# Patient Record
Sex: Female | Born: 1985 | Race: White | Hispanic: No | Marital: Single | Smoking: Former smoker
Health system: Southern US, Community
[De-identification: ages and names within clinical notes are randomized; demographics above are authoritative.]

## PROBLEM LIST (undated history)

## (undated) DIAGNOSIS — D649 Anemia, unspecified: Secondary | ICD-10-CM

## (undated) DIAGNOSIS — F329 Major depressive disorder, single episode, unspecified: Secondary | ICD-10-CM

## (undated) DIAGNOSIS — E079 Disorder of thyroid, unspecified: Secondary | ICD-10-CM

## (undated) DIAGNOSIS — F32A Depression, unspecified: Secondary | ICD-10-CM

## (undated) DIAGNOSIS — E039 Hypothyroidism, unspecified: Secondary | ICD-10-CM

## (undated) DIAGNOSIS — C801 Malignant (primary) neoplasm, unspecified: Secondary | ICD-10-CM

## (undated) HISTORY — PX: WISDOM TOOTH EXTRACTION: SHX21

## (undated) HISTORY — DX: Malignant (primary) neoplasm, unspecified: C80.1

## (undated) HISTORY — PX: CHOLECYSTECTOMY: SHX55

## (undated) HISTORY — PX: TONSILLECTOMY: SUR1361

---

## 2004-01-31 ENCOUNTER — Emergency Department (HOSPITAL_COMMUNITY): Admission: EM | Admit: 2004-01-31 | Discharge: 2004-01-31 | Payer: Self-pay | Admitting: Emergency Medicine

## 2011-02-02 ENCOUNTER — Encounter (HOSPITAL_BASED_OUTPATIENT_CLINIC_OR_DEPARTMENT_OTHER): Payer: Self-pay | Admitting: *Deleted

## 2011-02-02 ENCOUNTER — Emergency Department (HOSPITAL_BASED_OUTPATIENT_CLINIC_OR_DEPARTMENT_OTHER)
Admission: EM | Admit: 2011-02-02 | Discharge: 2011-02-03 | Disposition: A | Discharge status: Home / Self Care | Payer: PRIVATE HEALTH INSURANCE | Attending: Emergency Medicine | Admitting: Emergency Medicine

## 2011-02-02 DIAGNOSIS — M549 Dorsalgia, unspecified: Secondary | ICD-10-CM | POA: Insufficient documentation

## 2011-02-02 HISTORY — DX: Disorder of thyroid, unspecified: E07.9

## 2011-02-02 LAB — URINALYSIS, ROUTINE W REFLEX MICROSCOPIC
Glucose, UA: NEGATIVE mg/dL
Leukocytes, UA: NEGATIVE
Protein, ur: NEGATIVE mg/dL
Specific Gravity, Urine: 1.004 — ABNORMAL LOW (ref 1.005–1.030)
Urobilinogen, UA: 0.2 mg/dL (ref 0.0–1.0)

## 2011-02-02 LAB — PREGNANCY, URINE: Preg Test, Ur: NEGATIVE

## 2011-02-02 MED ORDER — OXYCODONE-ACETAMINOPHEN 5-325 MG PO TABS
2.0000 | ORAL_TABLET | Freq: Once | ORAL | Status: AC
  Administered 2011-02-02: 2 via ORAL
  Filled 2011-02-02: qty 2

## 2011-02-02 NOTE — ED Notes (Signed)
Left flank pain. Crying.

## 2011-02-03 ENCOUNTER — Emergency Department (INDEPENDENT_AMBULATORY_CARE_PROVIDER_SITE_OTHER): Payer: PRIVATE HEALTH INSURANCE

## 2011-02-03 DIAGNOSIS — R109 Unspecified abdominal pain: Secondary | ICD-10-CM

## 2011-02-03 DIAGNOSIS — M549 Dorsalgia, unspecified: Secondary | ICD-10-CM

## 2011-02-03 MED ORDER — OXYCODONE-ACETAMINOPHEN 5-325 MG PO TABS: 1.0000 | ORAL_TABLET | ORAL | Status: AC | PRN

## 2011-02-03 MED ORDER — ONDANSETRON HCL 4 MG/2ML IJ SOLN
4.0000 mg | Freq: Once | INTRAMUSCULAR | Status: AC
  Administered 2011-02-03: 4 mg via INTRAVENOUS
  Filled 2011-02-03: qty 2

## 2011-02-03 MED ORDER — HYDROMORPHONE HCL PF 1 MG/ML IJ SOLN
1.0000 mg | Freq: Once | INTRAMUSCULAR | Status: AC
  Administered 2011-02-03: 1 mg via INTRAVENOUS
  Filled 2011-02-03: qty 1

## 2011-02-03 MED ORDER — SODIUM CHLORIDE 0.9 % IV BOLUS (SEPSIS)
1000.0000 mL | Freq: Once | INTRAVENOUS | Status: AC
  Administered 2011-02-03: 1000 mL via INTRAVENOUS

## 2011-02-03 NOTE — ED Notes (Signed)
IV infused.  Pain much better

## 2011-02-03 NOTE — ED Provider Notes (Signed)
History     CSN: [id number]  Arrival date & time 02/02/11  2110   First MD Initiated Contact with Patient 02/02/11 2301      Chief Complaint  Patient presents with  . Back Pain     The history is provided by the patient.   the patient reports 2 days of intermittent left flank pain.  She reports the pain is now radiating down into her left groin.  She's had no dysuria or urinary frequency.  She's had no new vaginal discharge.  She denies hematuria.  She's had no fever or chills.  She denies nausea vomiting or diarrhea.  Nothing worsens her symptoms except for movement.  Nothing improves her symptoms.  She does report the pain is worse when she stretches with her left arm or stands straight up.  She does report an that she has been exercising and lifting weights, and her friend in the room reports that she has been "pushing herself more lately".  Her pain is moderate to severe at this time the  Past Medical History  Diagnosis Date  . Thyroid disease   . Asthma     History reviewed. No pertinent past surgical history.  No family history on file.  History  Substance Use Topics  . Smoking status: Never Smoker   . Smokeless tobacco: Not on file  . Alcohol Use: No    OB History    Grav Para Term Preterm Abortions TAB SAB Ect Mult Living                  Review of Systems  All other systems reviewed and are negative.    Allergies  Review of patient's allergies indicates no known allergies.  Home Medications   Current Outpatient Rx  Name Route Sig Dispense Refill  . ALBUTEROL SULFATE HFA 108 (90 BASE) MCG/ACT IN AERS Inhalation Inhale 2 puffs into the lungs every 6 (six) hours as needed. For shortness of breath    . VITAMIN D3 3000 UNITS PO TABS Oral Take 1 tablet by mouth daily.    Marland Kitchen VITAMIN B 12 PO Oral Take 1 tablet by mouth daily.    . IBUPROFEN 800 MG PO TABS Oral Take 800 mg by mouth every 8 (eight) hours as needed. For pain    . LEVOTHYROXINE SODIUM 100 MCG PO  TABS Oral Take 100 mcg by mouth daily.    . OXYCODONE-ACETAMINOPHEN 5-325 MG PO TABS Oral Take 1 tablet by mouth every 4 (four) hours as needed for pain. 115 tablet 0    BP 126/91  Pulse 81  Temp(Src) 97.5 F (36.4 C) (Oral)  Resp 20  SpO2 100%  LMP 02/01/2011  Physical Exam  Nursing note and vitals reviewed. Constitutional: She is oriented to person, place, and time. She appears well-developed and well-nourished. No distress.  HENT:  Head: Normocephalic and atraumatic.  Eyes: EOM are normal.  Neck: Normal range of motion.  Cardiovascular: Normal rate, regular rhythm and normal heart sounds.   Pulmonary/Chest: Effort normal and breath sounds normal.  Abdominal: Soft. She exhibits no distension.       Mild tenderness in her left lower quadrant without guarding or rebound.  No rash present  Genitourinary:       No rash noted.  No left CVA tenderness.  Musculoskeletal: Normal range of motion.  Neurological: She is alert and oriented to person, place, and time.  Skin: Skin is warm and dry.  Psychiatric: She has a normal mood and  affect. Judgment normal.    ED Course  Procedures (including critical care time)  Labs Reviewed  URINALYSIS, ROUTINE W REFLEX MICROSCOPIC - Abnormal; Notable for the following:    Specific Gravity, Urine 1.004 (*)    Hgb urine dipstick TRACE (*)    All other components within normal limits  PREGNANCY, URINE  URINE MICROSCOPIC-ADD ON   Ct Abdomen Pelvis Wo Contrast  02/03/2011  *RADIOLOGY REPORT*  Clinical Data: Back and left flank pain  CT ABDOMEN AND PELVIS WITHOUT CONTRAST  Technique:  Multidetector CT imaging of the abdomen and pelvis was performed following the standard protocol without intravenous contrast. Sagittal and coronal MPR images reconstructed from axial data set.  Comparison: None  Findings: Lung bases clear. Questionable tiny right middle lobe nodule 3 mm diameter image one. No urinary tract calcification, hydronephrosis or ureteral  dilatation. Within limits of a nonenhanced exam no focal abnormalities of the liver, spleen, pancreas, kidneys, or adrenal glands identified. Appendix not localized but no pericecal inflammatory process identified. Probable medication tablet within distal small bowel. Stomach and bowel loops otherwise normal appearance. Tampon in vagina. Bladder, uterus and adnexae unremarkable. Scattered normal-sized mesenteric lymph nodes. No mass, adenopathy, free fluid, or free air. Osseous structures unremarkable.  IMPRESSION: No acute intra abdominal or intrapelvic abnormalities. Question 3 mm right middle lobe pulmonary nodule, recommendation below.  If the patient is at high risk for bronchogenic carcinoma, follow- up chest CT at 1 year is recommended.  If the patient is at low risk, no follow-up is needed.  This recommendation follows the consensus statement: Guidelines for Management of Small Pulmonary Nodules Detected on CT Scans:  A Statement from the Fleischner Society as published in Radiology 2005; 237:395-400.  Available online at:  [email or web address].cz.  Original Report Authenticated By: Lollie Marrow, M.D.   i personally reviewed the ct scan  1. Back pain   2. Abdominal pain       MDM  Patient's pain is improved at this time.  Her CT scan is without acute pathology.  Her urine is normal.  Her repeat abdominal exam is benign.  The patient has a prior smoking history and therefore she is at risk for bronchogenic carcinoma.  She was instructed in the need for followup CT scan in one year given her questionable 3 mm right middle lobe pulmonary nodule.  The patient understands the importance of this.        Lyanne Co, MD 02/03/11 0120

## 2011-05-01 ENCOUNTER — Ambulatory Visit (INDEPENDENT_AMBULATORY_CARE_PROVIDER_SITE_OTHER): Payer: BC Managed Care – PPO | Admitting: Physician Assistant

## 2011-05-01 VITALS — BP 113/72 | HR 82 | Temp 97.8°F | Resp 16 | Ht 68.0 in | Wt 215.0 lb

## 2011-05-01 DIAGNOSIS — L89009 Pressure ulcer of unspecified elbow, unspecified stage: Secondary | ICD-10-CM

## 2011-05-01 DIAGNOSIS — R1013 Epigastric pain: Secondary | ICD-10-CM

## 2011-05-01 DIAGNOSIS — R112 Nausea with vomiting, unspecified: Secondary | ICD-10-CM

## 2011-05-01 LAB — POCT CBC
Granulocyte percent: 64.8 %G (ref 37–80)
MID (cbc): 1 — AB (ref 0–0.9)
MPV: 9.7 fL (ref 0–99.8)
POC MID %: 8.1 %M (ref 0–12)
Platelet Count, POC: 318 10*3/uL (ref 142–424)
RBC: 4.87 M/uL (ref 4.04–5.48)

## 2011-05-01 LAB — POCT UA - MICROSCOPIC ONLY
Crystals, Ur, HPF, POC: NEGATIVE
RBC, urine, microscopic: NEGATIVE
WBC, Ur, HPF, POC: NEGATIVE

## 2011-05-01 LAB — POCT URINALYSIS DIPSTICK
Bilirubin, UA: NEGATIVE
Ketones, UA: NEGATIVE
Leukocytes, UA: NEGATIVE
Protein, UA: NEGATIVE

## 2011-05-01 LAB — GLUCOSE, POCT (MANUAL RESULT ENTRY): POC Glucose: 85

## 2011-05-01 MED ORDER — RANITIDINE HCL 150 MG PO TABS: 150.0000 mg | ORAL_TABLET | Freq: Two times a day (BID) | ORAL | Status: DC

## 2011-05-01 MED ORDER — MAGIC MOUTHWASH W/LIDOCAINE: 10.0000 mL | ORAL | Status: DC | PRN

## 2011-05-01 MED ORDER — ONDANSETRON 8 MG PO TBDP: 8.0000 mg | ORAL_TABLET | Freq: Three times a day (TID) | ORAL | Status: AC | PRN

## 2011-05-01 NOTE — Patient Instructions (Signed)
Continue the Prilosec, and take the ranitidine along with it, for now.  Return the stool specimen when you are able so we can check it for the presence of blood. Avoid foods that are acidic, high in fat, spicy.

## 2011-05-01 NOTE — Progress Notes (Signed)
Subjective:    Patient ID: [name], female    DOB: July 31, 1985, 26 y.o.   MRN: [id number]  HPI Patient presents with daily N/V and epigastric abdominal pain x 2 weeks.  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  Pain unchanged by movement, position.  Has tried eliminating acidic, spicy, fried foods without benefit.  Prilosec 20 mg PO QD x 5 days without effect.  Non-smoker. Rare NSAID use.  Occasional EtOH.  Lots of stress.  Mom (adopted) had PUD, she's concerned her symptoms are similar.  Stool is like "sludge."  No overt blood.    No fever, chills, myalgias, arthralgias, rash. No urinary symptoms.  Not sexually active.  Fatigue, lack of sleep. Voice is lower than usual.   Review of Systems As above.    Objective:   Physical Exam  Vital signs noted. Well-developed, well nourished WF who is awake, alert and oriented, in NAD. HEENT: [place]/AT, sclera and conjunctiva are clear.   Neck: supple, non-tender, no lymphadenopathey, thyromegaly. Heart: RRR, no murmur Lungs: CTA Abdomen: normo-active bowel sounds, supple, no mass or organomegaly. Tenderness in the epigastrum. Extremities: no cyanosis, clubbing or edema. Skin: warm and dry without rash.  Results for orders placed in visit on 05/01/11  POCT CBC      Component Value Range   WBC 12.5 (*) 4.6 - 10.2 (K/uL)   Lymph, poc 3.4  0.6 - 3.4    POC LYMPH PERCENT 27.1  10 - 50 (%L)   MID (cbc) 1.0 (*) 0 - 0.9    POC MID % 8.1  0 - 12 (%M)   POC Granulocyte 8.1 (*) 2 - 6.9    Granulocyte percent 64.8  37 - 80 (%G)   RBC 4.87  4.04 - 5.48 (M/uL)   Hemoglobin 13.8  12.2 - 16.2 (g/dL)   HCT, POC 82.9  56.2 - 47.9 (%)   MCV 87.8  80 - 97 (fL)   MCH, POC 28.3  27 - 31.2 (pg)   MCHC 32.2  31.8 - 35.4 (g/dL)   RDW, POC 13.0     Platelet Count, POC 318  142 - 424 (K/uL)   MPV 9.7  0 - 99.8 (fL)  GLUCOSE, POCT (MANUAL RESULT ENTRY)      Component Value Range   POC Glucose 85    POCT UA -  MICROSCOPIC ONLY      Component Value Range   WBC, Ur, HPF, POC NEG     RBC, urine, microscopic NEG     Bacteria, U Microscopic NEG     Mucus, UA NEG     Epithelial cells, urine per micros 0-2     Crystals, Ur, HPF, POC NEG     Casts, Ur, LPF, POC NEG     Yeast, UA NEG    POCT URINALYSIS DIPSTICK      Component Value Range   Color, UA YELLOW     Clarity, UA CLEAR     Glucose, UA NEG     Bilirubin, UA NEG     Ketones, UA NEG     Spec Grav, UA 1.010     Blood, UA Trace-Intact     pH, UA 5.5     Protein, UA NEG     Urobilinogen, UA 0.2     Nitrite, UA NEG     Leukocytes, UA Negative          Assessment & Plan:   1.  Nausea & vomiting  POCT glucose (manual entry), POCT UA - Microscopic Only, POCT urinalysis dipstick, Comprehensive metabolic panel, H. pylori antibody, IgG, ranitidine (ZANTAC) 150 MG tablet, ondansetron (ZOFRAN-ODT) 8 MG disintegrating tablet  2. Epigastric abdominal pain  POCT CBC, IFOBT POC (occult bld, rslt in office), ranitidine (ZANTAC) 150 MG tablet, Alum & Mag Hydroxide-Simeth (MAGIC MOUTHWASH W/LIDOCAINE) SOLN   Patient Instructions  Continue the Prilosec, and take the ranitidine along with it, for now.  Return the stool specimen when you are able so we can check it for the presence of blood. Avoid foods that are acidic, high in fat, spicy.

## 2011-05-02 LAB — COMPREHENSIVE METABOLIC PANEL
ALT: 42 U/L — ABNORMAL HIGH (ref 0–35)
CO2: 26 mEq/L (ref 19–32)
Sodium: 137 mEq/L (ref 135–145)
Total Bilirubin: 0.2 mg/dL — ABNORMAL LOW (ref 0.3–1.2)
Total Protein: 7.4 g/dL (ref 6.0–8.3)

## 2011-05-02 LAB — H. PYLORI ANTIBODY, IGG: H Pylori IgG: 0.59 {ISR}

## 2011-05-20 ENCOUNTER — Encounter (HOSPITAL_COMMUNITY): Payer: Self-pay | Admitting: Emergency Medicine

## 2011-05-20 ENCOUNTER — Emergency Department (HOSPITAL_COMMUNITY): Payer: BC Managed Care – PPO

## 2011-05-20 ENCOUNTER — Inpatient Hospital Stay (HOSPITAL_COMMUNITY)
Admission: EM | Admit: 2011-05-20 | Discharge: 2011-05-22 | DRG: 813 | Disposition: A | Discharge status: Home / Self Care | Payer: BC Managed Care – PPO | Attending: Internal Medicine | Admitting: Internal Medicine

## 2011-05-20 ENCOUNTER — Inpatient Hospital Stay (HOSPITAL_COMMUNITY): Payer: BC Managed Care – PPO

## 2011-05-20 DIAGNOSIS — K810 Acute cholecystitis: Secondary | ICD-10-CM | POA: Diagnosis present

## 2011-05-20 DIAGNOSIS — R7401 Elevation of levels of liver transaminase levels: Secondary | ICD-10-CM | POA: Diagnosis present

## 2011-05-20 DIAGNOSIS — R740 Nonspecific elevation of levels of transaminase and lactic acid dehydrogenase [LDH]: Secondary | ICD-10-CM

## 2011-05-20 DIAGNOSIS — J45909 Unspecified asthma, uncomplicated: Secondary | ICD-10-CM | POA: Diagnosis present

## 2011-05-20 DIAGNOSIS — R1011 Right upper quadrant pain: Secondary | ICD-10-CM

## 2011-05-20 DIAGNOSIS — R7402 Elevation of levels of lactic acid dehydrogenase (LDH): Secondary | ICD-10-CM | POA: Diagnosis present

## 2011-05-20 DIAGNOSIS — R109 Unspecified abdominal pain: Principal | ICD-10-CM | POA: Diagnosis present

## 2011-05-20 LAB — LIPID PANEL
LDL Cholesterol: 61 mg/dL (ref 0–99)
Total CHOL/HDL Ratio: 3.5 RATIO
Triglycerides: 175 mg/dL — ABNORMAL HIGH (ref <150)
VLDL: 35 mg/dL (ref 0–40)

## 2011-05-20 LAB — DIFFERENTIAL
Basophils Absolute: 0 10*3/uL (ref 0.0–0.1)
Basophils Relative: 0 % (ref 0–1)
Eosinophils Absolute: 0.1 10*3/uL (ref 0.0–0.7)
Eosinophils Relative: 1 % (ref 0–5)
Monocytes Absolute: 1.1 10*3/uL — ABNORMAL HIGH (ref 0.1–1.0)
Neutro Abs: 9.4 10*3/uL — ABNORMAL HIGH (ref 1.7–7.7)

## 2011-05-20 LAB — URINALYSIS, ROUTINE W REFLEX MICROSCOPIC
Glucose, UA: NEGATIVE mg/dL
Hgb urine dipstick: NEGATIVE
Specific Gravity, Urine: 1.02 (ref 1.005–1.030)
Urobilinogen, UA: 1 mg/dL (ref 0.0–1.0)

## 2011-05-20 LAB — URINE MICROSCOPIC-ADD ON

## 2011-05-20 LAB — COMPREHENSIVE METABOLIC PANEL
ALT: 583 U/L — ABNORMAL HIGH (ref 0–35)
AST: 915 U/L — ABNORMAL HIGH (ref 0–37)
AST: 768 U/L — ABNORMAL HIGH (ref 0–37)
Albumin: 4.1 g/dL (ref 3.5–5.2)
Alkaline Phosphatase: 55 U/L (ref 39–117)
CO2: 25 mEq/L (ref 19–32)
Calcium: 8.9 mg/dL (ref 8.4–10.5)
Chloride: 104 mEq/L (ref 96–112)
Creatinine, Ser: 0.71 mg/dL (ref 0.50–1.10)
GFR calc Af Amer: >90 mL/min (ref >90)
GFR calc non Af Amer: >90 mL/min (ref >90)
Glucose, Bld: 115 mg/dL — ABNORMAL HIGH (ref 70–99)
Potassium: 3.6 mEq/L (ref 3.5–5.1)
Sodium: 137 mEq/L (ref 135–145)
Total Bilirubin: 2 mg/dL — ABNORMAL HIGH (ref 0.3–1.2)
Total Protein: 8.1 g/dL (ref 6.0–8.3)

## 2011-05-20 LAB — CBC
HCT: 40.4 % (ref 36.0–46.0)
MCH: 29.2 pg (ref 26.0–34.0)
MCHC: 34.2 g/dL (ref 30.0–36.0)
MCV: 85.6 fL (ref 78.0–100.0)
RDW: 12.5 % (ref 11.5–15.5)

## 2011-05-20 LAB — ACETAMINOPHEN LEVEL: Acetaminophen (Tylenol), Serum: <15 ug/mL (ref 10–30)

## 2011-05-20 LAB — RAPID URINE DRUG SCREEN, HOSP PERFORMED
Amphetamines: NOT DETECTED
Barbiturates: NOT DETECTED

## 2011-05-20 LAB — HEPATITIS B SURFACE ANTIGEN: Hepatitis B Surface Ag: NEGATIVE

## 2011-05-20 MED ORDER — ONDANSETRON HCL 4 MG/2ML IJ SOLN
4.0000 mg | Freq: Once | INTRAMUSCULAR | Status: AC
  Administered 2011-05-20: 4 mg via INTRAVENOUS
  Filled 2011-05-20: qty 2

## 2011-05-20 MED ORDER — DEXTROSE-NACL 5-0.45 % IV SOLN
INTRAVENOUS | Status: AC
  Administered 2011-05-20: 125 mL/h via INTRAVENOUS

## 2011-05-20 MED ORDER — SODIUM CHLORIDE 0.9 % IV SOLN
INTRAVENOUS | Status: DC
  Administered 2011-05-20 – 2011-05-21 (×3): via INTRAVENOUS

## 2011-05-20 MED ORDER — MORPHINE SULFATE 4 MG/ML IJ SOLN
4.0000 mg | Freq: Once | INTRAMUSCULAR | Status: AC
  Administered 2011-05-20: 4 mg via INTRAVENOUS
  Filled 2011-05-20: qty 1

## 2011-05-20 MED ORDER — ONDANSETRON HCL 4 MG PO TABS: 4.0000 mg | ORAL_TABLET | Freq: Four times a day (QID) | ORAL | Status: DC | PRN

## 2011-05-20 MED ORDER — IOHEXOL 300 MG/ML  SOLN
100.0000 mL | Freq: Once | INTRAMUSCULAR | Status: AC | PRN
  Administered 2011-05-20: 100 mL via INTRAVENOUS

## 2011-05-20 MED ORDER — LEVOTHYROXINE SODIUM 100 MCG PO TABS
100.0000 ug | ORAL_TABLET | Freq: Every day | ORAL | Status: DC
  Administered 2011-05-20 – 2011-05-22 (×3): 100 ug via ORAL
  Filled 2011-05-20 (×4): qty 1

## 2011-05-20 MED ORDER — ONDANSETRON HCL 4 MG/2ML IJ SOLN: 4.0000 mg | Freq: Three times a day (TID) | INTRAMUSCULAR | Status: DC | PRN

## 2011-05-20 MED ORDER — PIPERACILLIN-TAZOBACTAM IN DEX 2-0.25 GM/50ML IV SOLN
2.2500 g | Freq: Three times a day (TID) | INTRAVENOUS | Status: DC
  Administered 2011-05-20 – 2011-05-21 (×4): 2.25 g via INTRAVENOUS
  Filled 2011-05-20 (×5): qty 50

## 2011-05-20 MED ORDER — HYDROMORPHONE HCL PF 1 MG/ML IJ SOLN: 1.0000 mg | INTRAMUSCULAR | Status: DC | PRN

## 2011-05-20 MED ORDER — ONDANSETRON HCL 4 MG/2ML IJ SOLN
4.0000 mg | Freq: Four times a day (QID) | INTRAMUSCULAR | Status: DC | PRN
  Administered 2011-05-20: 4 mg via INTRAVENOUS
  Filled 2011-05-20: qty 2

## 2011-05-20 MED ORDER — HYDROCODONE-ACETAMINOPHEN 5-325 MG PO TABS: 1.0000 | ORAL_TABLET | ORAL | Status: DC | PRN

## 2011-05-20 MED ORDER — SODIUM CHLORIDE 0.9 % IV BOLUS (SEPSIS)
1000.0000 mL | Freq: Once | INTRAVENOUS | Status: AC
  Administered 2011-05-20: 1000 mL via INTRAVENOUS

## 2011-05-20 MED ORDER — ALBUTEROL SULFATE HFA 108 (90 BASE) MCG/ACT IN AERS: 2.0000 | INHALATION_SPRAY | Freq: Four times a day (QID) | RESPIRATORY_TRACT | Status: DC | PRN

## 2011-05-20 MED ORDER — HYDROMORPHONE HCL PF 1 MG/ML IJ SOLN
1.0000 mg | INTRAMUSCULAR | Status: DC | PRN
  Administered 2011-05-20 – 2011-05-21 (×4): 1 mg via INTRAVENOUS
  Filled 2011-05-20 (×4): qty 1

## 2011-05-20 NOTE — ED Provider Notes (Signed)
History     CSN: [id number]  Arrival date & time 05/20/11  0023   First MD Initiated Contact with Patient 05/20/11 0125      Chief Complaint  Patient presents with  . Abdominal Pain    (Consider location/radiation/quality/duration/timing/severity/associated sxs/prior treatment) HPI Patient is a 26 year old female who presents today complaining of epigastric and right upper quadrant pain that has been 8/10 and severe. Patient's pain will last for hours at a time and then subside. She notes it mainly in the morning. Patient has no history of abdominal surgeries. She has had nausea but no vomiting, diarrhea, or constipation. Patient denies fevers or sick contacts. Patient was seen for this 2 weeks ago at [facility] urgent care. There she was told to drink Magic mouthwash for her abdominal pain. Patient said that she has been doing this with slight improvement in her symptoms. She is on Zantac at baseline. Patient denies any association of her symptoms with eating. Though her past medical history indicated that she had a gastric ulcer patient has never had an endoscopy previously. Patient denies any radiation of her symptoms. She has no urinary symptoms or vaginal discharge. There are no other associated or modifying factors. Past Medical History  Diagnosis Date  . Thyroid disease   . Asthma   . Gastric ulcer     Past Surgical History  Procedure Date  . Tonsillectomy age 76  . Wisdom tooth extraction age 42    Family History  Problem Relation Age of Onset  . Adopted: Yes  . Diabetes Father     History  Substance Use Topics  . Smoking status: Never Smoker   . Smokeless tobacco: Not on file  . Alcohol Use: Yes     rare    OB History    Grav Para Term Preterm Abortions TAB SAB Ect Mult Living                  Review of Systems  Constitutional: Negative.   HENT: Negative.   Eyes: Negative.   Respiratory: Negative.   Cardiovascular: Negative.   Gastrointestinal: Positive for  nausea and abdominal pain.  Genitourinary: Negative.   Musculoskeletal: Negative.   Skin: Negative.   Neurological: Negative.   Hematological: Negative.   Psychiatric/Behavioral: Negative.   All other systems reviewed and are negative.    Allergies  Review of patient's allergies indicates no known allergies.  Home Medications   Current Outpatient Rx  Name Route Sig Dispense Refill  . ALBUTEROL SULFATE HFA 108 (90 BASE) MCG/ACT IN AERS Inhalation Inhale 2 puffs into the lungs every 6 (six) hours as needed. For shortness of breath    . VITAMIN D3 3000 UNITS PO TABS Oral Take 1 tablet by mouth daily.    Marland Kitchen VITAMIN B 12 PO Oral Take 1 tablet by mouth daily.    . IBUPROFEN 800 MG PO TABS Oral Take 800 mg by mouth every 8 (eight) hours as needed. For pain    . LEVOTHYROXINE SODIUM 100 MCG PO TABS Oral Take 100 mcg by mouth daily.    Marland Kitchen RANITIDINE HCL 150 MG PO TABS Oral Take 1 tablet (150 mg total) by mouth 2 (two) times daily. 60 tablet 0    BP 110/63  Pulse 74  Temp(Src) 97.5 F (36.4 C) (Oral)  Resp 16  SpO2 100%  LMP 05/03/2011  Physical Exam  Nursing note and vitals reviewed. GEN: Well-developed, well-nourished female in no distress HEENT: Atraumatic, normocephalic. Oropharynx clear without erythema EYES:  PERRLA BL, no scleral icterus. NECK: Trachea midline, no meningismus CV: regular rate and rhythm. No murmurs, rubs, or gallops PULM: No respiratory distress.  No crackles, wheezes, or rales. GI: soft, epigastric and RUQ TTP. No guarding, rebound. + bowel sounds  GU: deferred Neuro: cranial nerves 2-12 intact, no abnormalities of strength or sensation, A and O x 3 MSK: Patient moves all 4 extremities symmetrically, no deformity, edema, or injury noted Skin: No rashes petechiae, purpura, or jaundice Psych: no abnormality of mood   ED Course  Procedures (including critical care time)  Labs Reviewed  CBC - Abnormal; Notable for the following:    WBC 12.0 (*)    All  other components within normal limits  DIFFERENTIAL - Abnormal; Notable for the following:    Neutrophils Relative 78 (*)    Neutro Abs 9.4 (*)    Lymphocytes Relative 11 (*)    Monocytes Absolute 1.1 (*)    All other components within normal limits  COMPREHENSIVE METABOLIC PANEL - Abnormal; Notable for the following:    AST 915 (*)    ALT 559 (*)    Total Bilirubin 1.8 (*)    All other components within normal limits  URINALYSIS, ROUTINE W REFLEX MICROSCOPIC - Abnormal; Notable for the following:    Color, Urine AMBER (*) BIOCHEMICALS MAY BE AFFECTED BY COLOR   APPearance CLOUDY (*)    Bilirubin Urine SMALL (*)    Leukocytes, UA TRACE (*)    All other components within normal limits  URINE MICROSCOPIC-ADD ON - Abnormal; Notable for the following:    Squamous Epithelial / LPF MANY (*)    Bacteria, UA FEW (*)    All other components within normal limits  LIPASE, BLOOD   US Abdomen Complete  05/20/2011  *RADIOLOGY REPORT*  Clinical Data:  Abdominal pain  COMPLETE ABDOMINAL ULTRASOUND  Comparison:  02/03/2011 CT  Findings:  Gallbladder:  Sludge and suggestion of small cholesterol stones. No gallbladder wall thickening or pericholecystic fluid.  Negative sonographic Murphy's sign.  Common bile duct:  Measures within normal limits at 45 mm the.  The distal duct is poorly visualized due to overlying bowel gas artifact.  Liver:  No focal lesion identified.  Within normal limits in parenchymal echogenicity.  IVC:  Appears normal.  Pancreas:  Poorly visualized due to overlying bowel gas artifact  Spleen:   Normal sonographic appearance.  Mildly enlarged at 13 cm.  Right Kidney:  Measures 11.7 cm.  Within normal limits.  No hydronephrosis.  Left Kidney:  Measures 12.7 cm.  Within normal limits.  No hydronephrosis.  Abdominal aorta:  No aneurysm identified where seen however several areas were obscured by overlying bowel gas artifact.  Maximum diameter of identified is 2.1 cm.  IMPRESSION: Gallbladder  sludge and cholesterol stones without sonographic evidence for cholecystitis.  Pancreas and distal common bile duct are poorly visualized.  Original Report Authenticated By: Waneta Martins, M.D.     1. Transaminitis       MDM  Patient presents today complaining of epigastric and RUQ abdominal pain.  Laboratory work-up was remarkable for slight leukocytosis with elevated transaminases. Patient denied any significant alcohol use, new sexual partners, recent blood transfusions, international travel, significant GI symptoms aside from nausea and abdominal pain, significant Tylenol use, or other possible etiologies of this. Patient does have numerous tattoos but reports that she has always gotten these at locations where she was familiar with the sterilization process. Ultrasound was performed and did not show an  acute cholecystitis. Patient was admitted to the hospitalist for further management. Temporary holding orders were placed by myself.        Cyndra Numbers, MD 05/20/11 [phone]

## 2011-05-20 NOTE — ED Notes (Signed)
Pt states she started having abd pain about 0530 this morning improved around 1300 then spiked back up about 1730 and has progressively gotten worse   Pt has nausea without vomiting or diarrhea

## 2011-05-20 NOTE — Progress Notes (Signed)
Nuclear medicine called and stated patient test cannot be done over the weekend unless is STAT, Dr. Truitt Merle and patient aware that it will be done on Monday.

## 2011-05-20 NOTE — H&P (Signed)
PCP:  Tally Due, MD, MD   DOA:  05/20/2011 12:24 AM  Chief Complaint:  Abdominal pain  HPI: Pt is 26 yo female with no significant past medical history who presents to [facility] emergency department with main concern of progressively worsening, generalized abdominal pain, that initially started 2-3 days prior to admission and is associated with poor oral intake, nausea, intermittent, nonbloody vomiting. Patient describes abdominal pain as a dull, intermittent in nature, 7/10 in severity at the past and 10/10 in severity at worst. Patient denies similar episodes of pain in the past, she denies recent sicknesses or hospitalizations, she denies alcohol or drug use, she denies sick contacts or exposures. Patient also denies history of hepatitis, denies history of sexually transmitted disease. Patient denies fevers and chills, denies chest pain or shortness of breath, no cough, no other systemic symptoms of weight loss or weight gain, no night sweats.  Allergies: No Known Allergies  Prior to Admission medications   Medication Sig Start Date End Date Taking? Authorizing Provider  albuterol (PROVENTIL HFA;VENTOLIN HFA) 108 (90 BASE) MCG/ACT inhaler Inhale 2 puffs into the lungs every 6 (six) hours as needed. For shortness of breath   Yes Historical Provider, MD  Cholecalciferol (VITAMIN D3) 3000 UNITS TABS Take 1 tablet by mouth daily.   Yes Historical Provider, MD  Cyanocobalamin (VITAMIN B 12 PO) Take 1 tablet by mouth daily.   Yes Historical Provider, MD  ibuprofen (ADVIL,MOTRIN) 800 MG tablet Take 800 mg by mouth every 8 (eight) hours as needed. For pain   Yes Historical Provider, MD  levothyroxine (SYNTHROID, LEVOTHROID) 100 MCG tablet Take 100 mcg by mouth daily.   Yes Historical Provider, MD  ranitidine (ZANTAC) 150 MG tablet Take 1 tablet (150 mg total) by mouth 2 (two) times daily. 05/01/11 04/30/12 Yes Chelle Tessa Lerner, PA-C    Past Medical History  Diagnosis Date  . Thyroid  disease   . Asthma   . Gastric ulcer     Past Surgical History  Procedure Date  . Tonsillectomy age 58  . Wisdom tooth extraction age 63    Social History:  reports that she has never smoked. She does not have any smokeless tobacco history on file. She reports that she drinks alcohol. She reports that she does not use illicit drugs.  Family History  Problem Relation Age of Onset  . Adopted: Yes  . Diabetes Father     Review of Systems:  Constitutional: Denies fever, chills, diaphoresis.  HEENT: Denies photophobia, eye pain, redness, hearing loss, ear pain, congestion, sore throat, rhinorrhea, sneezing, mouth sores, trouble swallowing, neck pain, neck stiffness and tinnitus.   Respiratory: Denies SOB, DOE, cough, chest tightness,  and wheezing.   Cardiovascular: Denies chest pain, palpitations and leg swelling.  Gastrointestinal: Denies diarrhea, constipation, blood in stool and abdominal distention.  Genitourinary: Denies dysuria, urgency, frequency, hematuria, flank pain and difficulty urinating.  Musculoskeletal: Denies myalgias, back pain, joint swelling, arthralgias and gait problem.  Skin: Denies pallor, rash and wound.  Neurological: Denies dizziness, seizures, syncope, weakness, light-headedness, numbness and headaches.  Hematological: Denies adenopathy. Easy bruising, personal or family bleeding history  Psychiatric/Behavioral: Denies suicidal ideation, mood changes, confusion, nervousness, sleep disturbance and agitation   Physical Exam:  Filed Vitals:   05/20/11 0026 05/20/11 0127 05/20/11 0226 05/20/11 0535  BP: 128/86 128/87 133/90 110/63  Pulse: 88 105 81 74  Temp: 97.8 F (36.6 C) 97.5 F (36.4 C)    TempSrc: Oral Oral  Resp: 16 18 18 16   SpO2: 100% 98% 99% 100%    Constitutional: Vital signs reviewed.  Patient is in no acute distress and cooperative with exam. Alert and oriented x3.  Head: Normocephalic and atraumatic Ear: TM normal  bilaterally Mouth: no erythema or exudates, MMM Eyes: PERRL, EOMI, conjunctivae normal, No scleral icterus.  Neck: Supple, Trachea midline normal ROM, No JVD, mass, thyromegaly, or carotid bruit present.  Cardiovascular: RRR, S1 normal, S2 normal, no MRG, pulses symmetric and intact bilaterally Pulmonary/Chest: CTAB, no wheezes, rales, or rhonchi Abdominal: Soft. Tender in epigastric area, non-distended, bowel sounds are normal, no masses, organomegaly, or guarding present.  GU: no CVA tenderness Musculoskeletal: No joint deformities, erythema, or stiffness, ROM full and no nontender Ext: no edema and no cyanosis, pulses palpable bilaterally (DP and PT) Hematology: no cervical, inginal, or axillary adenopathy.  Neurological: A&O x3, Strenght is normal and symmetric bilaterally, cranial nerve II-XII are grossly intact, no focal motor deficit, sensory intact to light touch bilaterally.  Skin: Warm, dry and intact. No rash, cyanosis, or clubbing.  Psychiatric: Normal mood and affect. speech and behavior is normal. Judgment and thought content normal. Cognition and memory are normal.   Labs on Admission:  Results for orders placed during the hospital encounter of 05/20/11 (from the past 48 hour(s))  PREGNANCY, URINE     Status: Normal   Collection Time   05/20/11  2:02 AM      Component Value Range Comment   Preg Test, Ur NEGATIVE  NEGATIVE    URINALYSIS, ROUTINE W REFLEX MICROSCOPIC     Status: Abnormal   Collection Time   05/20/11  2:03 AM      Component Value Range Comment   Color, Urine AMBER (*) YELLOW  BIOCHEMICALS MAY BE AFFECTED BY COLOR   APPearance CLOUDY (*) CLEAR     Specific Gravity, Urine 1.020  1.005 - 1.030     pH 6.0  5.0 - 8.0     Glucose, UA NEGATIVE  NEGATIVE (mg/dL)    Hgb urine dipstick NEGATIVE  NEGATIVE     Bilirubin Urine SMALL (*) NEGATIVE     Ketones, ur NEGATIVE  NEGATIVE (mg/dL)    Protein, ur NEGATIVE  NEGATIVE (mg/dL)    Urobilinogen, UA 1.0  0.0 - 1.0  (mg/dL)    Nitrite NEGATIVE  NEGATIVE     Leukocytes, UA TRACE (*) NEGATIVE    URINE MICROSCOPIC-ADD ON     Status: Abnormal   Collection Time   05/20/11  2:03 AM      Component Value Range Comment   Squamous Epithelial / LPF MANY (*) RARE     WBC, UA 0-2  <3 (WBC/hpf)    Bacteria, UA FEW (*) RARE     Urine-Other MUCOUS PRESENT     CBC     Status: Abnormal   Collection Time   05/20/11  2:30 AM      Component Value Range Comment   WBC 12.0 (*) 4.0 - 10.5 (K/uL)    RBC 4.72  3.87 - 5.11 (MIL/uL)    Hemoglobin 13.8  12.0 - 15.0 (g/dL)    HCT 16.1  09.6 - 04.5 (%)    MCV 85.6  78.0 - 100.0 (fL)    MCH 29.2  26.0 - 34.0 (pg)    MCHC 34.2  30.0 - 36.0 (g/dL)    RDW 40.9  81.1 - 91.4 (%)    Platelets 315  150 - 400 (K/uL)   DIFFERENTIAL  Status: Abnormal   Collection Time   05/20/11  2:30 AM      Component Value Range Comment   Neutrophils Relative 78 (*) 43 - 77 (%)    Neutro Abs 9.4 (*) 1.7 - 7.7 (K/uL)    Lymphocytes Relative 11 (*) 12 - 46 (%)    Lymphs Abs 1.3  0.7 - 4.0 (K/uL)    Monocytes Relative 10  3 - 12 (%)    Monocytes Absolute 1.1 (*) 0.1 - 1.0 (K/uL)    Eosinophils Relative 1  0 - 5 (%)    Eosinophils Absolute 0.1  0.0 - 0.7 (K/uL)    Basophils Relative 0  0 - 1 (%)    Basophils Absolute 0.0  0.0 - 0.1 (K/uL)   COMPREHENSIVE METABOLIC PANEL     Status: Abnormal   Collection Time   05/20/11  2:30 AM      Component Value Range Comment   Sodium 135  135 - 145 (mEq/L)    Potassium 4.1  3.5 - 5.1 (mEq/L)    Chloride 101  96 - 112 (mEq/L)    CO2 22  19 - 32 (mEq/L)    Glucose, Bld 98  70 - 99 (mg/dL)    BUN 9  6 - 23 (mg/dL)    Creatinine, Ser 1.30  0.50 - 1.10 (mg/dL)    Calcium 8.9  8.4 - 10.5 (mg/dL)    Total Protein 8.1  6.0 - 8.3 (g/dL)    Albumin 4.1  3.5 - 5.2 (g/dL)    AST 865 (*) 0 - 37 (U/L)    ALT 559 (*) 0 - 35 (U/L)    Alkaline Phosphatase 59  39 - 117 (U/L)    Total Bilirubin 1.8 (*) 0.3 - 1.2 (mg/dL)    GFR calc non Af Amer >90  >90 (mL/min)     GFR calc Af Amer >90  >90 (mL/min)   LIPASE, BLOOD     Status: Normal   Collection Time   05/20/11  2:30 AM      Component Value Range Comment   Lipase 24  11 - 59 (U/L)   ACETAMINOPHEN LEVEL     Status: Normal   Collection Time   05/20/11  6:20 AM      Component Value Range Comment   Acetaminophen (Tylenol), Serum <15.0  10 - 30 (ug/mL)   ETHANOL     Status: Normal   Collection Time   05/20/11  6:20 AM      Component Value Range Comment   Alcohol, Ethyl (B) <11  0 - 11 (mg/dL)   SALICYLATE LEVEL     Status: Abnormal   Collection Time   05/20/11  6:20 AM      Component Value Range Comment   Salicylate Lvl <2.0 (*) 2.8 - 20.0 (mg/dL)   COMPREHENSIVE METABOLIC PANEL     Status: Abnormal   Collection Time   05/20/11  6:20 AM      Component Value Range Comment   Sodium 137  135 - 145 (mEq/L)    Potassium 3.6  3.5 - 5.1 (mEq/L)    Chloride 104  96 - 112 (mEq/L)    CO2 25  19 - 32 (mEq/L)    Glucose, Bld 115 (*) 70 - 99 (mg/dL)    BUN 7  6 - 23 (mg/dL)    Creatinine, Ser 7.84  0.50 - 1.10 (mg/dL)    Calcium 8.2 (*) 8.4 - 10.5 (mg/dL)    Total Protein  7.0  6.0 - 8.3 (g/dL)    Albumin 3.5  3.5 - 5.2 (g/dL)    AST 161 (*) 0 - 37 (U/L)    ALT 583 (*) 0 - 35 (U/L)    Alkaline Phosphatase 55  39 - 117 (U/L)    Total Bilirubin 2.0 (*) 0.3 - 1.2 (mg/dL)    GFR calc non Af Amer >90  >90 (mL/min)    GFR calc Af Amer >90  >90 (mL/min)   PROTIME-INR     Status: Normal   Collection Time   05/20/11  6:20 AM      Component Value Range Comment   Prothrombin Time 14.2  11.6 - 15.2 (seconds)    INR 1.08  0.00 - 1.49    CK     Status: Normal   Collection Time   05/20/11  6:20 AM      Component Value Range Comment   Total CK 82  7 - 177 (U/L)     Radiological Exams on Admission: No results found.  Assessment/Plan  Abdominal pain - Unclear etiology at this time, transaminitis noted on admission labs - This is of unclear etiology but per abdominal US and CT abdomen there is a possibility of  acute cholecystitis - Will admit patient to medical floor and provide supportive care with IV fluids, antiemetics, analgesia for adequate pain control - Will also proceed with obtaining biliary study as recommended per CT of the abdomen finding - CMET in AM  Transaminitis - Unclear etiology but perhaps related to gallbladder etiology - AST and ALT are already trending down - Will obtain complete metabolic panel in the morning - Hepatitis panel still pending, autoimmune workup also pending  DVT Prophylaxis - SCD  Code Status - Full  Education  - test results and diagnostic studies were discussed with patient and pt's family who was present at the bedside - patient and family have verbalized the understanding - questions were answered at the bedside and contact information was provided for additional questions or concerns  Time Spent on Admission: Over 30 minutes  MAGICK-Rubina Basinski 05/20/2011, 7:59 AM  [facility] Pager # [phone] Main Office # [phone]

## 2011-05-21 DIAGNOSIS — R1011 Right upper quadrant pain: Secondary | ICD-10-CM

## 2011-05-21 DIAGNOSIS — R740 Nonspecific elevation of levels of transaminase and lactic acid dehydrogenase [LDH]: Secondary | ICD-10-CM

## 2011-05-21 LAB — COMPREHENSIVE METABOLIC PANEL
ALT: 403 U/L — ABNORMAL HIGH (ref 0–35)
AST: 243 U/L — ABNORMAL HIGH (ref 0–37)
Albumin: 3.2 g/dL — ABNORMAL LOW (ref 3.5–5.2)
Alkaline Phosphatase: 59 U/L (ref 39–117)
CO2: 22 mEq/L (ref 19–32)
Calcium: 8.4 mg/dL (ref 8.4–10.5)
Chloride: 104 mEq/L (ref 96–112)
Creatinine, Ser: 0.79 mg/dL (ref 0.50–1.10)
GFR calc Af Amer: >90 mL/min (ref >90)
GFR calc non Af Amer: >90 mL/min (ref >90)
Glucose, Bld: 91 mg/dL (ref 70–99)
Glucose, Bld: 94 mg/dL (ref 70–99)
Potassium: 3.9 mEq/L (ref 3.5–5.1)
Sodium: 139 mEq/L (ref 135–145)
Total Bilirubin: 0.6 mg/dL (ref 0.3–1.2)
Total Protein: 6.5 g/dL (ref 6.0–8.3)

## 2011-05-21 LAB — CBC
Hemoglobin: 11.9 g/dL — ABNORMAL LOW (ref 12.0–15.0)
MCHC: 32.6 g/dL (ref 30.0–36.0)
Platelets: 253 10*3/uL (ref 150–400)
RDW: 13 % (ref 11.5–15.5)

## 2011-05-21 LAB — HEPATITIS B CORE ANTIBODY, IGM: Hep B C IgM: NEGATIVE

## 2011-05-21 LAB — HEPATITIS A ANTIBODY, IGM: Hep A IgM: NEGATIVE

## 2011-05-21 MED ORDER — ALPRAZOLAM 1 MG PO TABS
1.0000 mg | ORAL_TABLET | Freq: Once | ORAL | Status: AC
  Administered 2011-05-21: 1 mg via ORAL
  Filled 2011-05-21: qty 1

## 2011-05-21 MED ORDER — PIPERACILLIN-TAZOBACTAM 3.375 G IVPB
3.3750 g | Freq: Three times a day (TID) | INTRAVENOUS | Status: DC
  Administered 2011-05-21 – 2011-05-22 (×3): 3.375 g via INTRAVENOUS
  Filled 2011-05-21 (×4): qty 50

## 2011-05-21 NOTE — Progress Notes (Signed)
Patient ID: [name], female   DOB: [birth year], 26 y.o.   MRN: [id number]  Subjective: No events overnight. Patient denies chest pain, shortness of breath, abdominal pain. Had bowel movement and reports ambulating.  Objective:  Vital signs in last 24 hours:  Filed Vitals:   05/20/11 1155 05/20/11 1400 05/20/11 2139 05/21/11 0616  BP:  111/69 103/65 102/66  Pulse:  75 70 67  Temp:  98.6 F (37 C) 97.9 F (36.6 C) 97.5 F (36.4 C)  TempSrc:  Oral Oral Oral  Resp:  16 18 18   Height: 5\' 6"  (1.676 m)     Weight:    98.113 kg (216 lb 4.8 oz)  SpO2:  96% 97% 95%    Intake/Output from previous day:   Intake/Output Summary (Last 24 hours) at 05/21/11 0832 Last data filed at 05/21/11 0659  Gross per 24 hour  Intake 1842.5 ml  Output      0 ml  Net 1842.5 ml    Physical Exam: General: Alert, awake, oriented x3, in no acute distress. HEENT: No bruits, no goiter. Moist mucous membranes, no scleral icterus, no conjunctival pallor. Heart: Regular rate and rhythm, S1/S2 +, no murmurs, rubs, gallops. Lungs: Clear to auscultation bilaterally. No wheezing, no rhonchi, no rales.  Abdomen: Soft, nontender, nondistended, positive bowel sounds. Extremities: No clubbing or cyanosis, no pitting edema,  positive pedal pulses. Neuro: Grossly nonfocal.  Lab Results:   Lab 05/21/11 0508 05/20/11 0230  WBC 7.5 12.0*  HGB 11.9* 13.8  HCT 36.5 40.4  PLT 253 315    Lab 05/21/11 0508 05/20/11 0620 05/20/11 0230  NA 139 137 135  K 3.9 3.6 4.1  CL 106 104 101  CO2 25 25 22   GLUCOSE 91 115* 98  BUN 6 7 9   CREATININE 0.86 0.69 0.71  CALCIUM 8.1* 8.2* 8.9    Lab 05/20/11 0620  INR 1.08  PROTIME --   Studies/Results: US Abdomen Complete  05/20/2011  *RADIOLOGY REPORT*  Clinical Data:  Abdominal pain  COMPLETE ABDOMINAL ULTRASOUND  Comparison:  02/03/2011 CT  Findings:  Gallbladder:  Sludge and suggestion of small cholesterol stones. No gallbladder wall thickening or pericholecystic  fluid.  Negative sonographic Murphy's sign.  Common bile duct:  Measures within normal limits at 45 mm the.  The distal duct is poorly visualized due to overlying bowel gas artifact.  Liver:  No focal lesion identified.  Within normal limits in parenchymal echogenicity.  IVC:  Appears normal.  Pancreas:  Poorly visualized due to overlying bowel gas artifact  Spleen:   Normal sonographic appearance.  Mildly enlarged at 13 cm.  Right Kidney:  Measures 11.7 cm.  Within normal limits.  No hydronephrosis.  Left Kidney:  Measures 12.7 cm.  Within normal limits.  No hydronephrosis.  Abdominal aorta:  No aneurysm identified where seen however several areas were obscured by overlying bowel gas artifact.  Maximum diameter of identified is 2.1 cm.  IMPRESSION: Gallbladder sludge and cholesterol stones without sonographic evidence for cholecystitis.  Pancreas and distal common bile duct are poorly visualized.  Original Report Authenticated By: Waneta Martins, M.D.   Ct Abdomen Pelvis W Contrast  05/20/2011  *RADIOLOGY REPORT*  Clinical Data: 26 year old female with abdominal and pelvic pain. Gallbladder sludge.  CT ABDOMEN AND PELVIS WITH CONTRAST  Technique:  Multidetector CT imaging of the abdomen and pelvis was performed following the standard protocol during bolus administration of intravenous contrast.  Contrast: OMNIPAQUE IOHEXOL 300 MG/ML  SOLN  Comparison: 02/03/2011  Findings: The gallbladder wall is upper limits of normal in size. There may be very mild stranding/inflammation near the gallbladder neck/proximal duodenum of uncertain origin.  The liver, spleen, adrenal glands, pancreas, and kidneys are unremarkable. No free fluid, enlarged lymph nodes, biliary dilation or abdominal aortic aneurysm identified. The remainder of the bowel and bladder are unremarkable. No acute or suspicious bony abnormalities are identified.  IMPRESSION: Very mild stranding/inflammation near the gallbladder neck/proximal  duodenum. This is nonspecific but could represent early acute cholecystitis or duodenitis.  If there is strong clinical suspicion for acute cholecystitis, consider nuclear medicine study.  Original Report Authenticated By: Rosendo Gros, M.D.    Medications: Scheduled Meds:   . dextrose 5 % and 0.45% NaCl   Intravenous STAT  . levothyroxine  100 mcg Oral Daily  . piperacillin-tazobactam (ZOSYN)  IV  2.25 g Intravenous Q8H   Continuous Infusions:   . sodium chloride 75 mL/hr at 05/21/11 0659   PRN Meds:.albuterol, HYDROcodone-acetaminophen, HYDROmorphone (DILAUDID) injection, iohexol, ondansetron (ZOFRAN) IV, ondansetron  Assessment/Plan:  Abdominal pain  - Unclear etiology at this time, transaminitis noted on admission labs  - This is of unclear etiology but per abdominal US and CT abdomen there is a possibility of acute cholecystitis  - Will  provide supportive care with IV fluids, antiemetics, analgesia for adequate pain control  - Will also proceed with obtaining biliary study as recommended per CT of the abdomen finding  - CMET in AM   Transaminitis  - Unclear etiology but perhaps related to gallbladder etiology  - AST and ALT are already trending down  - Will obtain complete metabolic panel in the morning  - Hepatitis panel still pending, autoimmune workup also pending   DVT Prophylaxis - SCD   Code Status - Full   Education  - test results and diagnostic studies were discussed with patient and pt's family who was present at the bedside  - patient and family have verbalized the understanding  - questions were answered at the bedside and contact information was provided for additional questions    LOS: 1 day   MAGICK-Azaryah Oleksy 05/21/2011, 8:32 AM  [facility] Pager: [phone]

## 2011-05-22 ENCOUNTER — Telehealth (INDEPENDENT_AMBULATORY_CARE_PROVIDER_SITE_OTHER): Payer: Self-pay | Admitting: General Surgery

## 2011-05-22 ENCOUNTER — Inpatient Hospital Stay (HOSPITAL_COMMUNITY): Payer: BC Managed Care – PPO

## 2011-05-22 DIAGNOSIS — R1011 Right upper quadrant pain: Secondary | ICD-10-CM

## 2011-05-22 DIAGNOSIS — R740 Nonspecific elevation of levels of transaminase and lactic acid dehydrogenase [LDH]: Secondary | ICD-10-CM

## 2011-05-22 LAB — ANTI-SMOOTH MUSCLE ANTIBODY, IGG: F-Actin IgG: 12 U (ref <20)

## 2011-05-22 LAB — CBC
Hemoglobin: 12.2 g/dL (ref 12.0–15.0)
MCHC: 33 g/dL (ref 30.0–36.0)
RBC: 4.21 MIL/uL (ref 3.87–5.11)
WBC: 8.8 10*3/uL (ref 4.0–10.5)

## 2011-05-22 MED ORDER — HYDROCODONE-ACETAMINOPHEN 5-325 MG PO TABS: 1.0000 | ORAL_TABLET | ORAL | Status: AC | PRN

## 2011-05-22 MED ORDER — ALPRAZOLAM 1 MG PO TABS: 1.0000 mg | ORAL_TABLET | Freq: Every evening | ORAL | Status: AC | PRN

## 2011-05-22 MED ORDER — ONDANSETRON HCL 4 MG PO TABS: 4.0000 mg | ORAL_TABLET | Freq: Four times a day (QID) | ORAL | Status: AC | PRN

## 2011-05-22 MED ORDER — CIPROFLOXACIN HCL 500 MG PO TABS: 500.0000 mg | ORAL_TABLET | Freq: Two times a day (BID) | ORAL | Status: AC

## 2011-05-22 MED ORDER — TECHNETIUM TC 99M MEBROFENIN IV KIT
7.3000 | PACK | Freq: Once | INTRAVENOUS | Status: AC | PRN
  Administered 2011-05-22: 7 via INTRAVENOUS

## 2011-05-22 MED ORDER — MORPHINE SULFATE 4 MG/ML IJ SOLN
4.0000 mg | Freq: Once | INTRAMUSCULAR | Status: AC
  Administered 2011-05-22: 4 mg via INTRAVENOUS

## 2011-05-22 NOTE — Discharge Summary (Signed)
Patient ID: Jamiaya Bina MRN: [id number] DOB/AGE: November 18, 1985 26 y.o.  Admit date: 05/20/2011 Discharge date: 05/22/2011  Primary Care Physician:  Tally Due, MD, MD  Discharge Diagnoses:  Abdominal pain secondary to ? Acute cholecystitis and transaminitis.   Medication List  As of 05/22/2011  1:29 PM   STOP taking these medications         ibuprofen 800 MG tablet         TAKE these medications         albuterol 108 (90 BASE) MCG/ACT inhaler   Commonly known as: PROVENTIL HFA;VENTOLIN HFA   Inhale 2 puffs into the lungs every 6 (six) hours as needed. For shortness of breath      ALPRAZolam 1 MG tablet   Commonly known as: XANAX   Take 1 tablet (1 mg total) by mouth at bedtime as needed for sleep.      ciprofloxacin 500 MG tablet   Commonly known as: CIPRO   Take 1 tablet (500 mg total) by mouth 2 (two) times daily.      HYDROcodone-acetaminophen 5-325 MG per tablet   Commonly known as: NORCO   Take 1-2 tablets by mouth every 4 (four) hours as needed.      levothyroxine 100 MCG tablet   Commonly known as: SYNTHROID, LEVOTHROID   Take 100 mcg by mouth daily.      ondansetron 4 MG tablet   Commonly known as: ZOFRAN   Take 1 tablet (4 mg total) by mouth every 6 (six) hours as needed for nausea.      ranitidine 150 MG tablet   Commonly known as: ZANTAC   Take 1 tablet (150 mg total) by mouth 2 (two) times daily.      VITAMIN B 12 PO   Take 1 tablet by mouth daily.      Vitamin D3 3000 UNITS Tabs   Take 1 tablet by mouth daily.            Disposition and Follow-up: Pt will need to follow up with surgery in the next week and the office will call her for an appointment.  Consults: none  Significant Diagnostic Studies:   US Abdomen Complete 05/20/2011    IMPRESSION:  Gallbladder sludge and cholesterol stones without sonographic evidence for cholecystitis.  Pancreas and distal common bile duct are poorly visualized.    Ct Abdomen Pelvis W  Contrast 05/20/2011    IMPRESSION:  Very mild stranding/inflammation near the gallbladder neck/proximal duodenum. This is nonspecific but could represent early acute cholecystitis or duodenitis.  If there is strong clinical suspicion for acute cholecystitis, consider nuclear medicine study.   Brief H and P: Pt is 26 yo female with no significant past medical history who presents to [facility] emergency department with main concern of progressively worsening, generalized abdominal pain, that initially started 2-3 days prior to admission and is associated with poor oral intake, nausea, intermittent, nonbloody vomiting. Patient describes abdominal pain as a dull, intermittent in nature, 7/10 in severity at the past and 10/10 in severity at worst. Patient denies similar episodes of pain in the past, she denies recent sicknesses or hospitalizations, she denies alcohol or drug use, she denies sick contacts or exposures. Patient also denies history of hepatitis, denies history of sexually transmitted disease. Patient denies fevers and chills, denies chest pain or shortness of breath, no cough, no other systemic symptoms of weight loss or weight gain, no night sweats.  Physical Exam on Discharge:  Filed Vitals:  05/21/11 0616 05/21/11 1400 05/21/11 2101 05/22/11 0451  BP: 102/66 115/80 112/77 90/52  Pulse: 67 75 105 65  Temp: 97.5 F (36.4 C) 98.4 F (36.9 C) 97.6 F (36.4 C) 98.1 F (36.7 C)  TempSrc: Oral Oral Oral Oral  Resp: 18 16 16 16   Height:      Weight: 98.113 kg (216 lb 4.8 oz)     SpO2: 95% 98% 97% 96%    Intake/Output Summary (Last 24 hours) at 05/22/11 1329 Last data filed at 05/22/11 0655  Gross per 24 hour  Intake 1642.5 ml  Output      0 ml  Net 1642.5 ml    General: Alert, awake, oriented x3, in no acute distress. HEENT: No bruits, no goiter. Heart: Regular rate and rhythm, without murmurs, rubs, gallops. Lungs: Clear to auscultation bilaterally. Abdomen: Soft,  nontender, nondistended, positive bowel sounds. Extremities: No clubbing cyanosis or edema with positive pedal pulses. Neuro: Grossly intact, nonfocal.  LABS:  Lab 05/22/11 0454 05/21/11 0508 05/20/11 0230  WBC 8.8 7.5 12.0*  HGB 12.2 11.9* 13.8  HCT 37.0 36.5 40.4  PLT 296 253 315   Lab 05/21/11 1005 05/21/11 0508 05/20/11 0620 05/20/11 0230  NA 137 139 137 135  K 3.9 3.9 3.6 4.1  CL 104 106 104 101  CO2 22 25 25 22   GLUCOSE 94 91 115* 98  BUN 6 6 7 9   CREATININE 0.79 0.86 0.69 0.71  CALCIUM 8.4 8.1* 8.2* 8.9   Hospital Course:  Abdominal pain  - Unclear etiology at this time, transaminitis noted on admission labs  - This is of unclear etiology but per abdominal US and CT abdomen there is a possibility of acute cholecystitis  - We provided supportive care with IV fluids, antiemetics, analgesia for adequate pain control  - pt has responded well to the medical therapy and has been tolerating PO intake well with no nausea and no vomitng - her CMET panel showing decreasing trend for transaminases - I spoke with surgery on call and the recommendation was to follow up with surgery in the next week for further evaluation and management  Transaminitis  - Unclear etiology but perhaps related to gallbladder etiology  - AST and ALT are already trending down  - hepatitis panel is negative and the autoimmune work up is also unremarkable  Code Status - Full   Education  - test results and diagnostic studies were discussed with patient and pt's family who was present at the bedside  - patient and family have verbalized the understanding  - questions were answered at the bedside and contact information was provided for additional questions   Time spent on Discharge: Over 30 minutes  Signed: Debbora Presto 05/22/2011, 1:29 PM  [facility], pager #: [phone] Main office number: [phone]

## 2011-05-22 NOTE — Discharge Instructions (Signed)
Gallbladder Disease  Gallbladder disease (cholecystitis) is an inflammation of your gallbladder. It is usually caused by a build-up of stones (gallstones) or sludge (cholelithiasis) in your gallbladder. The gallbladder is not an essential organ. It is located slightly to the right of center in the belly (abdomen), behind the liver. It stores bile made in the liver. Bile aids in digestion of fats. Gallbladder disease may result in nausea (feeling sick to your stomach), abdominal pain, and jaundice. In severe cases, emergency surgery may be required.  The most common type of gallbladder disease is gallstones. They begin as small crystals and slowly grow into stones. Gallstone pain occurs when the bile duct has spasms. The spasms are caused by the stone passing out of the duct. The stone is trying to pass at the same time bile is passing into the small bowel for digestion. The pain usually begins suddenly. It may persist from several minutes to several hours. Infection can occur. Infection can add to discomfort and severity of an acute attack. The pain may be made worse by breathing deeply or by being jarred. There may be fever and tenderness to the touch. In some cases, when gallstones do not move into the bile duct, people have no pain or symptoms. These are called "silent" gallstones.  Women are three times more likely to develop gallstones than men. Women who have had several pregnancies are more likely to have gallbladder disease. Physicians sometimes advise removing diseased gallbladders before future pregnancies. Other factors that increase the risk of gallbladder disease are obesity, diets heavy in fried foods and dairy products, increasing age, prolonged use of medications containing female hormones, and heredity.  HOME CARE INSTRUCTIONS    If your physician prescribed an antibiotic, take as directed.   Only take over-the-counter or prescription medicines for pain, discomfort, or fever as directed by your  caregiver.   Follow a low fat diet until seen again. (Fat causes the gallbladder to contract.)   Follow-up as instructed. Attacks are almost always recurrent and surgery is usually required for permanent treatment.  SEEK IMMEDIATE MEDICAL CARE IF:    Pain is increasing and not controlled by medications.   The pain moves to another part of your abdomen or to your back. (Right sided pain can be appendicitis and left sided pain in adults can be diverticulitis).   You have a fever.   You develop nausea and vomiting.  Document Released: 12/19/2004 Document Revised: 12/08/2010 Document Reviewed: 11/04/2010  ExitCare Patient Information 2012 [employer].

## 2011-05-22 NOTE — Progress Notes (Signed)
   CARE MANAGEMENT NOTE 05/22/2011  Patient:  ChristinaChristina Brewer   Account Number:  [id number]  Date Initiated:  05/22/2011  Documentation initiated by:  Jiles Crocker  Subjective/Objective Assessment:   ADMITTED WITH ABDOMINAL PAIN     Action/Plan:   PCP:  Tally Due, MD; INDEPENDENT PRIOR TO ADMISSION   Anticipated DC Date:  05/26/2011   Anticipated DC Plan:  HOME/SELF CARE           Status of service:  In process, will continue to follow Medicare Important Message given?  NA - LOS <3 / Initial given by admissions (If response is "NO", the following Medicare IM given date fields will be blank)  Per UR Regulation:  Reviewed for med. necessity/level of care/duration of stay  Comments:  05/22/2011- B Evanne Matsunaga RN, BSN, MHA

## 2011-05-22 NOTE — Telephone Encounter (Signed)
Dr. Izola Price calling after consulting with Dr. Luisa Hart, asking for pt to be seen in the next week for appt to evaluate for gallbladder surgery.  Please schedule and contact pt.  Thanks.

## 2011-05-24 ENCOUNTER — Other Ambulatory Visit (HOSPITAL_COMMUNITY)
Admission: RE | Admit: 2011-05-24 | Discharge: 2011-05-24 | Disposition: A | Discharge status: Home / Self Care | Payer: BC Managed Care – PPO | Source: Ambulatory Visit | Attending: Family Medicine | Admitting: Family Medicine

## 2011-05-24 ENCOUNTER — Other Ambulatory Visit: Payer: Self-pay | Admitting: Family Medicine

## 2011-05-24 DIAGNOSIS — Z124 Encounter for screening for malignant neoplasm of cervix: Secondary | ICD-10-CM | POA: Insufficient documentation

## 2011-05-24 LAB — ANTI-MICROSOMAL ANTIBODY LIVER / KIDNEY: Liver-Kidney Microsomal Ab: <=20 U (ref <=20.0)

## 2011-05-25 ENCOUNTER — Encounter (INDEPENDENT_AMBULATORY_CARE_PROVIDER_SITE_OTHER): Payer: Self-pay

## 2011-06-12 ENCOUNTER — Ambulatory Visit (INDEPENDENT_AMBULATORY_CARE_PROVIDER_SITE_OTHER): Payer: BC Managed Care – PPO | Admitting: Surgery

## 2011-06-12 ENCOUNTER — Encounter (INDEPENDENT_AMBULATORY_CARE_PROVIDER_SITE_OTHER): Payer: Self-pay | Admitting: Surgery

## 2011-06-12 VITALS — BP 118/86 | HR 82 | Temp 98.5°F | Resp 16 | Ht 66.0 in | Wt 213.2 lb

## 2011-06-12 DIAGNOSIS — K829 Disease of gallbladder, unspecified: Secondary | ICD-10-CM

## 2011-06-12 NOTE — Patient Instructions (Signed)

## 2011-06-12 NOTE — Progress Notes (Signed)
Patient ID: [name], female   DOB: 03-11-1985, 26 y.o.   MRN: [id number]  Chief Complaint  Patient presents with  . Abdominal Pain    new pt- eval GB    HPI [name] is a 26 y.o. female.   HPIPatient sent at the request of Dr. Izola Price do history of right upper quadrant pain, epigastric pain and elevated liver function studies. She is in the hospital about 3 weeks ago being admitted for workup of abdominal pain, elevated liver function study and abnormal HIDA study. She has a history of the last 3 months of intermittent epigastric pain and right upper quadrant pain made worse with eating but also spontaneous attacks of pain in between meals. Ultrasound showed sludge and HIDA showed delayed gallbladder filling. Her AST and ALT were markedly elevated rostrally admission with a mild elevation in her bilirubin and alkaline phosphatase. She was worked up for a common bile duct stone which was negative and then discharged home once her symptoms resolved.  Past Medical History  Diagnosis Date  . Thyroid disease   . Asthma   . Gastric ulcer     Past Surgical History  Procedure Date  . Tonsillectomy age 19  . Wisdom tooth extraction age 37    Family History  Problem Relation Age of Onset  . Adopted: Yes  . Diabetes Father     Social History History  Substance Use Topics  . Smoking status: Former Smoker    Quit date: 01/03/2008  . Smokeless tobacco: Not on file  . Alcohol Use: Yes     rare    No Known Allergies  Current Outpatient Prescriptions  Medication Sig Dispense Refill  . albuterol (PROVENTIL HFA;VENTOLIN HFA) 108 (90 BASE) MCG/ACT inhaler Inhale 2 puffs into the lungs every 6 (six) hours as needed. For shortness of breath      . ALPRAZolam (XANAX) 1 MG tablet Take 1 tablet (1 mg total) by mouth at bedtime as needed for sleep.  30 tablet  0  . Cyanocobalamin (VITAMIN B 12 PO) Take 1 tablet by mouth daily.      Marland Kitchen levothyroxine (SYNTHROID, LEVOTHROID) 100 MCG tablet  Take 100 mcg by mouth daily.      . ranitidine (ZANTAC) 150 MG tablet Take 1 tablet (150 mg total) by mouth 2 (two) times daily.  60 tablet  0  . Vitamin D, Ergocalciferol, (DRISDOL) 50000 UNITS CAPS Take 50,000 Units by mouth every 7 (seven) days.        Review of Systems Review of Systems  Constitutional: Negative for fever, chills and unexpected weight change.  HENT: Negative for hearing loss, congestion, sore throat, trouble swallowing and voice change.   Eyes: Negative for visual disturbance.  Respiratory: Negative for cough and wheezing.   Cardiovascular: Negative for chest pain, palpitations and leg swelling.  Gastrointestinal: Positive for abdominal pain. Negative for nausea, vomiting, diarrhea, constipation, blood in stool, abdominal distention and anal bleeding.  Genitourinary: Negative for hematuria, vaginal bleeding and difficulty urinating.  Musculoskeletal: Negative for arthralgias.  Skin: Negative for rash and wound.  Neurological: Negative for seizures, syncope and headaches.  Hematological: Negative for adenopathy. Does not bruise/bleed easily.  Psychiatric/Behavioral: Negative for confusion.    Blood pressure 118/86, pulse 82, temperature 98.5 F (36.9 C), temperature source Temporal, resp. rate 16, height 5\' 6"  (1.676 m), weight 213 lb 3.2 oz (96.707 kg), last menstrual period 05/03/2011.  Physical Exam Physical Exam  Constitutional: She is oriented to person, place, and time. She  appears well-developed and well-nourished.  HENT:  Head: Normocephalic and atraumatic.  Eyes: EOM are normal. Pupils are equal, round, and reactive to light.  Neck: Normal range of motion. Neck supple.  Cardiovascular: Normal rate, regular rhythm and normal heart sounds.   Pulmonary/Chest: Effort normal and breath sounds normal.  Abdominal: Soft. Bowel sounds are normal. She exhibits no distension. There is no tenderness. There is no rebound and no guarding.  Musculoskeletal: Normal  range of motion.  Neurological: She is alert and oriented to person, place, and time.  Skin: Skin is warm and dry.  Psychiatric: She has a normal mood and affect. Her behavior is normal. Judgment and thought content normal.    Data Reviewed Ultrasound gallbladder shows sludge. Common duct normal. HIDA study shows delayed filling of the gallbladder. Liver function studies showed elevated transaminases which have returned toward baseline over one week with a normal bilirubin as a 5/19/ 2013.  Assessment      Gallbladder sludge with symptoms of biliary colic with history of elevated transaminases and negative hepatitis workup.      Plan    Recommend laparoscopic cholecystectomy cholangiogram. Her symptoms have been ongoing for at least 3 months. Her workup has been exhaustive. Her symptoms though do sound like biliary colic and I think she will benefit from laparoscopic cholecystectomy and cholangiogram. The pattern of elevation of liver function studies is not typical of choledocholithiasis nor cholecystitis but it is possible she passed a stone to explain. Her hepatitis screen is negative at this point. She had large procedure surgery.The procedure has been discussed with the patient. Operative and non operative treatments have been discussed. Risks of surgery include bleeding, infection,  Common bile duct injury,  Injury to the stomach,liver, colon,small intestine, abdominal wall,  Diaphragm,  Major blood vessels,  And the need for an open procedure.  Other risks include worsening of medical problems, death,  DVT and pulmonary embolism, and cardiovascular events.   Medical options have also been discussed. The patient has been informed of long term expectations of surgery and non surgical options,  The patient agrees to proceed.         Jaedah Lords A. 06/12/2011, 12:21 PM

## 2011-07-31 ENCOUNTER — Other Ambulatory Visit (INDEPENDENT_AMBULATORY_CARE_PROVIDER_SITE_OTHER): Payer: Self-pay

## 2011-07-31 ENCOUNTER — Other Ambulatory Visit (INDEPENDENT_AMBULATORY_CARE_PROVIDER_SITE_OTHER): Payer: Self-pay | Admitting: Surgery

## 2011-07-31 DIAGNOSIS — K8010 Calculus of gallbladder with chronic cholecystitis without obstruction: Secondary | ICD-10-CM

## 2011-08-17 ENCOUNTER — Ambulatory Visit (INDEPENDENT_AMBULATORY_CARE_PROVIDER_SITE_OTHER): Payer: BC Managed Care – PPO | Admitting: Surgery

## 2011-08-17 ENCOUNTER — Encounter (INDEPENDENT_AMBULATORY_CARE_PROVIDER_SITE_OTHER): Payer: Self-pay | Admitting: Surgery

## 2011-08-17 VITALS — BP 118/82 | HR 76 | Temp 99.2°F | Resp 16 | Ht 66.0 in | Wt 211.6 lb

## 2011-08-17 DIAGNOSIS — Z9889 Other specified postprocedural states: Secondary | ICD-10-CM

## 2011-08-17 MED ORDER — CHOLESTYRAMINE LIGHT 4 G PO PACK: 4.0000 g | PACK | Freq: Two times a day (BID) | ORAL | Status: DC

## 2011-08-17 NOTE — Progress Notes (Signed)
Patient returns that laparoscopic cholecystectomy and cholangiogram. She is significant elevation of her AST ALT preop and has a history of this in the past. She is to see gastroenterology this. She is having diarrhea with meals.  Exam: The patient is not jaundiced. Abdomen soft nontender his incisions are healing well.  Impression: Chronic cholecystitis and cholelithiasis per pathology report status post laparoscopic cholecystectomy cholangiogram with elevation in liver function studies with unclear etiology  Plan: Cholestyramine 3 times a day with meals. Follow up gastroenterology. Call if diarrhea does not improve in 2 months.

## 2011-08-17 NOTE — Patient Instructions (Signed)
Cholestyramine for diarrhea 3 times a day before meal.  If not better in 2 months call back.  Follow up with GI medicine for liver function studies.  No restictions

## 2011-08-22 ENCOUNTER — Telehealth (INDEPENDENT_AMBULATORY_CARE_PROVIDER_SITE_OTHER): Payer: Self-pay

## 2011-08-22 NOTE — Telephone Encounter (Signed)
[name] called to get records for the upcoming appt where we referred her there.  She needs our office notes and labs.  I faxed them to [phone] attn referrals.

## 2012-05-29 ENCOUNTER — Other Ambulatory Visit: Payer: Self-pay | Admitting: Family Medicine

## 2012-05-29 DIAGNOSIS — E049 Nontoxic goiter, unspecified: Secondary | ICD-10-CM

## 2012-06-10 ENCOUNTER — Ambulatory Visit
Admission: RE | Admit: 2012-06-10 | Discharge: 2012-06-10 | Disposition: A | Discharge status: Home / Self Care | Payer: Managed Care, Other (non HMO) | Source: Ambulatory Visit | Attending: Family Medicine | Admitting: Family Medicine

## 2012-06-10 DIAGNOSIS — E049 Nontoxic goiter, unspecified: Secondary | ICD-10-CM

## 2012-06-17 ENCOUNTER — Other Ambulatory Visit: Payer: Self-pay | Admitting: Family Medicine

## 2012-06-17 DIAGNOSIS — E041 Nontoxic single thyroid nodule: Secondary | ICD-10-CM

## 2012-07-02 ENCOUNTER — Ambulatory Visit
Admission: RE | Admit: 2012-07-02 | Discharge: 2012-07-02 | Disposition: A | Discharge status: Home / Self Care | Payer: Managed Care, Other (non HMO) | Source: Ambulatory Visit | Attending: Family Medicine | Admitting: Family Medicine

## 2012-07-02 ENCOUNTER — Other Ambulatory Visit (HOSPITAL_COMMUNITY)
Admission: RE | Admit: 2012-07-02 | Discharge: 2012-07-02 | Disposition: A | Discharge status: Home / Self Care | Payer: Managed Care, Other (non HMO) | Source: Ambulatory Visit | Attending: Interventional Radiology | Admitting: Interventional Radiology

## 2012-07-02 DIAGNOSIS — E041 Nontoxic single thyroid nodule: Secondary | ICD-10-CM

## 2012-07-02 DIAGNOSIS — E049 Nontoxic goiter, unspecified: Secondary | ICD-10-CM | POA: Insufficient documentation

## 2012-07-26 ENCOUNTER — Ambulatory Visit (INDEPENDENT_AMBULATORY_CARE_PROVIDER_SITE_OTHER): Payer: Commercial Indemnity | Admitting: Surgery

## 2012-07-26 ENCOUNTER — Encounter (INDEPENDENT_AMBULATORY_CARE_PROVIDER_SITE_OTHER): Payer: Self-pay | Admitting: Surgery

## 2012-07-26 VITALS — BP 120/70 | HR 74 | Resp 16 | Ht 66.0 in | Wt 218.6 lb

## 2012-07-26 DIAGNOSIS — E039 Hypothyroidism, unspecified: Secondary | ICD-10-CM

## 2012-07-26 DIAGNOSIS — D440 Neoplasm of uncertain behavior of thyroid gland: Secondary | ICD-10-CM

## 2012-07-26 DIAGNOSIS — D449 Neoplasm of uncertain behavior of unspecified endocrine gland: Secondary | ICD-10-CM

## 2012-07-26 NOTE — Progress Notes (Signed)
General Surgery [facility]  Chief Complaint  Patient presents with  . New Evaluation    evaluate thyroid nodule - referral from Dr. Juluis Rainier    HISTORY: Patient is a 27 year old female referred by her primary care physician with newly identified thyroid nodule. Patient has a history of hypothyroidism. She has been on Synthroid for 10 years. She has had a difficult time regulating her dosage of thyroid hormone replacement. Patient underwent a thyroid ultrasound in June 2014. This shows a slightly enlarged thyroid gland which is in homogeneity. There was a poorly defined 1.6 cm nodule in the right thyroid lobe. In July 2014 she underwent ultrasound-guided fine needle aspiration biopsy. This shows a follicular lesion of undetermined significance. Patient is referred to general surgery for evaluation and recommendations at this time.  No family history is available as the patient is adopted. She has had no prior surgery on the head or neck. She has no history of other endocrinopathy.  Past Medical History  Diagnosis Date  . Thyroid disease   . Asthma   . Gastric ulcer     Current Outpatient Prescriptions  Medication Sig Dispense Refill  . Cyanocobalamin (VITAMIN B 12 PO) Take 1 tablet by mouth daily.      Marland Kitchen levothyroxine (SYNTHROID, LEVOTHROID) 100 MCG tablet Take 100 mcg by mouth daily.      . ondansetron (ZOFRAN) 4 MG tablet Take 4 mg by mouth every 8 (eight) hours as needed.      . sertraline (ZOLOFT) 50 MG tablet       . Vitamin D, Ergocalciferol, (DRISDOL) 50000 UNITS CAPS       . cholestyramine light (PREVALITE) 4 G packet Take 1 packet (4 g total) by mouth 2 (two) times daily.  30 packet  3   No current facility-administered medications for this visit.    No Known Allergies  Family History  Problem Relation Age of Onset  . Adopted: Yes  . Diabetes Father     History   Social History  . Marital Status: Significant Other    Spouse Name: N/A     Number of Children: N/A  . Years of Education: N/A   Social History Main Topics  . Smoking status: Former Smoker    Quit date: 01/03/2008  . Smokeless tobacco: None  . Alcohol Use: Yes     Comment: rare  . Drug Use: No  . Sexually Active:    Other Topics Concern  . None   Social History Narrative  . None    REVIEW OF SYSTEMS - PERTINENT POSITIVES ONLY: Denies tremor. Denies palpitations. Denies compressive symptoms. Mild globus sensation.  EXAM: Filed Vitals:   07/26/12 1548  BP: 120/70  Pulse: 74  Resp: 16    HEENT: normocephalic; pupils equal and reactive; sclerae clear; dentition good; mucous membranes moist NECK:  Firm thyroid gland at the upper limits of normal size, right lobe slightly nodular; symmetric on extension; no palpable anterior or posterior cervical lymphadenopathy; no supraclavicular masses; no tenderness CHEST: clear to auscultation bilaterally without rales, rhonchi, or wheezes CARDIAC: regular rate and rhythm without significant murmur; peripheral pulses are full EXT:  non-tender without edema; no deformity NEURO: no gross focal deficits; no sign of tremor   LABORATORY RESULTS: See [facility] (CHL-Epic) for most recent results  RADIOLOGY RESULTS: See [facility] (CHL-Epic) for most recent results  IMPRESSION: #1 right thyroid nodule, 1.6 cm, poorly defined, follicular by cytopathology #2 small thyroid goiter, in homogeneity,  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 We discussed options for management. 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 The nodule is poorly defined. Cytopathology shows a follicular lesion but there is no atypia or evidence of malignancy identified.  I have recommended short term follow-up with repeat physical examination, ultrasound, and TSH  level in 6 months. I am happy that she will be seeing Dr. Debara Pickett in endocrinology in the near future. Hopefully he will be able to better balance her thyroid hormone replacement.  We will await Dr. Daune Perch evaluation and recommendations before making a final decision about observation versus surgical resection.  Velora Heckler, MD, FACS General & Endocrine Surgery [facility]  Primary Care Physician: Gaye Alken, MD

## 2012-07-26 NOTE — Patient Instructions (Signed)

## 2012-08-08 ENCOUNTER — Other Ambulatory Visit: Payer: Self-pay | Admitting: Gastroenterology

## 2012-08-21 ENCOUNTER — Other Ambulatory Visit (INDEPENDENT_AMBULATORY_CARE_PROVIDER_SITE_OTHER): Payer: Self-pay | Admitting: Surgery

## 2012-08-21 ENCOUNTER — Encounter (HOSPITAL_COMMUNITY): Payer: Self-pay | Admitting: Pharmacy Technician

## 2012-08-21 ENCOUNTER — Telehealth (INDEPENDENT_AMBULATORY_CARE_PROVIDER_SITE_OTHER): Payer: Self-pay

## 2012-08-21 DIAGNOSIS — E039 Hypothyroidism, unspecified: Secondary | ICD-10-CM

## 2012-08-21 DIAGNOSIS — D440 Neoplasm of uncertain behavior of thyroid gland: Secondary | ICD-10-CM

## 2012-08-21 NOTE — Telephone Encounter (Signed)
Surgery orders complete in epic by Dr Gerrit Friends and sched sheet to surgery schedulers.

## 2012-08-21 NOTE — Progress Notes (Signed)
Patient has been seen in consultation by her endocrinologist. After further discussion, she has decided to proceed with thyroidectomy for definitive diagnosis and management.  Will schedule for total thyroidectomy at a time convenient for the patient.  The risks and benefits of the procedure have been discussed at length with the patient.  The patient understands the proposed procedure, potential alternative treatments, and the course of recovery to be expected.  All of the patient's questions have been answered at this time.  The patient wishes to proceed with surgery.  Velora Heckler, MD, FACS General & Endocrine Surgery [facility] Office: [phone]

## 2012-08-23 ENCOUNTER — Encounter (HOSPITAL_COMMUNITY): Payer: Self-pay

## 2012-08-23 ENCOUNTER — Ambulatory Visit (HOSPITAL_COMMUNITY)
Admission: RE | Admit: 2012-08-23 | Discharge: 2012-08-23 | Disposition: A | Discharge status: Home / Self Care | Payer: Managed Care, Other (non HMO) | Source: Ambulatory Visit | Attending: Surgery | Admitting: Surgery

## 2012-08-23 ENCOUNTER — Encounter (HOSPITAL_COMMUNITY)
Admission: RE | Admit: 2012-08-23 | Discharge: 2012-08-23 | Disposition: A | Discharge status: Home / Self Care | Payer: Managed Care, Other (non HMO) | Source: Ambulatory Visit | Attending: Gynecologic Oncology | Admitting: Gynecologic Oncology

## 2012-08-23 DIAGNOSIS — E041 Nontoxic single thyroid nodule: Secondary | ICD-10-CM | POA: Insufficient documentation

## 2012-08-23 DIAGNOSIS — Z01818 Encounter for other preprocedural examination: Secondary | ICD-10-CM | POA: Insufficient documentation

## 2012-08-23 DIAGNOSIS — J45909 Unspecified asthma, uncomplicated: Secondary | ICD-10-CM | POA: Insufficient documentation

## 2012-08-23 DIAGNOSIS — Z01812 Encounter for preprocedural laboratory examination: Secondary | ICD-10-CM | POA: Insufficient documentation

## 2012-08-23 HISTORY — DX: Depression, unspecified: F32.A

## 2012-08-23 HISTORY — DX: Major depressive disorder, single episode, unspecified: F32.9

## 2012-08-23 HISTORY — DX: Anemia, unspecified: D64.9

## 2012-08-23 HISTORY — DX: Hypothyroidism, unspecified: E03.9

## 2012-08-23 LAB — CBC
HCT: 39.4 % (ref 36.0–46.0)
MCH: 28.9 pg (ref 26.0–34.0)
MCHC: 33.5 g/dL (ref 30.0–36.0)
MCV: 86.2 fL (ref 78.0–100.0)
Platelets: 293 10*3/uL (ref 150–400)
RDW: 12.8 % (ref 11.5–15.5)
WBC: 8 10*3/uL (ref 4.0–10.5)

## 2012-08-23 NOTE — Progress Notes (Signed)
Quick Note:  These results are acceptable for scheduled surgery.  [name], MD, FACS [facility] Office: [phone]   ______ 

## 2012-08-23 NOTE — Patient Instructions (Signed)
Christianne Gannett  08/23/2012   Your procedure is scheduled on:  08/30/12               Surgery 1029am-1229pm  Report to [facility] at    0800  AM.  Call this number if you have problems the morning of surgery: [phone]   Remember:   Do not eat food or drink liquids after midnight.   Take these medicines the morning of surgery with A SIP OF WATER:    Do not wear jewelry, make-up or nail polish.  Do not wear lotions, powders, or perfumes.   Do not shave 48 hours prior to surgery.   Do not bring valuables to the hospital.  Contacts, dentures or bridgework may not be worn into surgery.  Leave suitcase in the car. After surgery it may be brought to your room.  For patients admitted to the hospital, checkout time is 11:00 AM the day of  discharge.   SEE CHG INSTRUCTION SHEET    Please read over the following fact sheets that you were given coughing and deep breathing exercises, leg exercises               Failure to comply with these instructions may result in cancellation of your surgery.                Patient Signature ____________________________              Nurse Signature _____________________________

## 2012-08-30 ENCOUNTER — Encounter (HOSPITAL_COMMUNITY): Payer: Self-pay | Admitting: Anesthesiology

## 2012-08-30 ENCOUNTER — Observation Stay (HOSPITAL_COMMUNITY)
Admission: RE | Admit: 2012-08-30 | Discharge: 2012-08-31 | Disposition: A | Discharge status: Home / Self Care | Payer: Managed Care, Other (non HMO) | Source: Ambulatory Visit | Attending: Surgery | Admitting: Surgery

## 2012-08-30 ENCOUNTER — Ambulatory Visit (HOSPITAL_COMMUNITY): Payer: Managed Care, Other (non HMO) | Admitting: Anesthesiology

## 2012-08-30 ENCOUNTER — Encounter (HOSPITAL_COMMUNITY): Payer: Self-pay | Admitting: *Deleted

## 2012-08-30 ENCOUNTER — Encounter (HOSPITAL_COMMUNITY)
Admission: RE | Disposition: A | Discharge status: Home / Self Care | Payer: Self-pay | Source: Ambulatory Visit | Attending: Surgery

## 2012-08-30 DIAGNOSIS — E039 Hypothyroidism, unspecified: Secondary | ICD-10-CM | POA: Insufficient documentation

## 2012-08-30 DIAGNOSIS — Z79899 Other long term (current) drug therapy: Secondary | ICD-10-CM | POA: Insufficient documentation

## 2012-08-30 DIAGNOSIS — C73 Malignant neoplasm of thyroid gland: Principal | ICD-10-CM | POA: Insufficient documentation

## 2012-08-30 DIAGNOSIS — D440 Neoplasm of uncertain behavior of thyroid gland: Secondary | ICD-10-CM | POA: Diagnosis present

## 2012-08-30 HISTORY — PX: THYROIDECTOMY: SHX17

## 2012-08-30 SURGERY — THYROIDECTOMY
Anesthesia: General | Site: Neck | Wound class: Clean

## 2012-08-30 MED ORDER — PROPOFOL 10 MG/ML IV BOLUS
INTRAVENOUS | Status: DC | PRN
  Administered 2012-08-30: 150 mg via INTRAVENOUS

## 2012-08-30 MED ORDER — ONDANSETRON HCL 4 MG PO TABS: 4.0000 mg | ORAL_TABLET | Freq: Four times a day (QID) | ORAL | Status: DC | PRN

## 2012-08-30 MED ORDER — HYDROMORPHONE HCL PF 1 MG/ML IJ SOLN
INTRAMUSCULAR | Status: AC
  Filled 2012-08-30: qty 1

## 2012-08-30 MED ORDER — ROCURONIUM BROMIDE 100 MG/10ML IV SOLN
INTRAVENOUS | Status: DC | PRN
  Administered 2012-08-30: 5 mg via INTRAVENOUS
  Administered 2012-08-30: 35 mg via INTRAVENOUS
  Administered 2012-08-30: 10 mg via INTRAVENOUS

## 2012-08-30 MED ORDER — SUCCINYLCHOLINE CHLORIDE 20 MG/ML IJ SOLN
INTRAMUSCULAR | Status: DC | PRN
  Administered 2012-08-30: 100 mg via INTRAVENOUS

## 2012-08-30 MED ORDER — SERTRALINE HCL 50 MG PO TABS
50.0000 mg | ORAL_TABLET | Freq: Every day | ORAL | Status: DC
  Administered 2012-08-30: 50 mg via ORAL
  Filled 2012-08-30 (×2): qty 1

## 2012-08-30 MED ORDER — ONDANSETRON HCL 4 MG/2ML IJ SOLN
4.0000 mg | Freq: Four times a day (QID) | INTRAMUSCULAR | Status: DC | PRN
  Administered 2012-08-30 (×2): 4 mg via INTRAVENOUS
  Filled 2012-08-30 (×2): qty 2

## 2012-08-30 MED ORDER — KCL IN DEXTROSE-NACL 20-5-0.45 MEQ/L-%-% IV SOLN
INTRAVENOUS | Status: AC
  Filled 2012-08-30: qty 1000

## 2012-08-30 MED ORDER — ONDANSETRON HCL 4 MG/2ML IJ SOLN
INTRAMUSCULAR | Status: DC | PRN
  Administered 2012-08-30: 4 mg via INTRAVENOUS

## 2012-08-30 MED ORDER — PROMETHAZINE HCL 25 MG/ML IJ SOLN: 6.2500 mg | INTRAMUSCULAR | Status: DC | PRN

## 2012-08-30 MED ORDER — DEXAMETHASONE SODIUM PHOSPHATE 10 MG/ML IJ SOLN
INTRAMUSCULAR | Status: DC | PRN
  Administered 2012-08-30: 10 mg via INTRAVENOUS

## 2012-08-30 MED ORDER — HYDROCODONE-ACETAMINOPHEN 5-325 MG PO TABS
1.0000 | ORAL_TABLET | ORAL | Status: DC | PRN
  Administered 2012-08-30 – 2012-08-31 (×5): 2 via ORAL
  Filled 2012-08-30 (×5): qty 2

## 2012-08-30 MED ORDER — KCL IN DEXTROSE-NACL 20-5-0.45 MEQ/L-%-% IV SOLN
INTRAVENOUS | Status: DC
  Administered 2012-08-30: 14:00:00 via INTRAVENOUS
  Filled 2012-08-30 (×2): qty 1000

## 2012-08-30 MED ORDER — CEFAZOLIN SODIUM-DEXTROSE 2-3 GM-% IV SOLR
INTRAVENOUS | Status: AC
  Filled 2012-08-30: qty 50

## 2012-08-30 MED ORDER — CEFAZOLIN SODIUM-DEXTROSE 2-3 GM-% IV SOLR
2.0000 g | INTRAVENOUS | Status: AC
  Administered 2012-08-30: 2 g via INTRAVENOUS

## 2012-08-30 MED ORDER — EPHEDRINE SULFATE 50 MG/ML IJ SOLN
INTRAMUSCULAR | Status: DC | PRN
  Administered 2012-08-30: 5 mg via INTRAVENOUS

## 2012-08-30 MED ORDER — CALCIUM CARBONATE 1250 (500 CA) MG PO TABS
2.0000 | ORAL_TABLET | Freq: Three times a day (TID) | ORAL | Status: DC
  Administered 2012-08-30 – 2012-08-31 (×2): 1000 mg via ORAL
  Filled 2012-08-30 (×5): qty 2

## 2012-08-30 MED ORDER — GLYCOPYRROLATE 0.2 MG/ML IJ SOLN
INTRAMUSCULAR | Status: DC | PRN
  Administered 2012-08-30: .6 mg via INTRAVENOUS

## 2012-08-30 MED ORDER — ACETAMINOPHEN 325 MG PO TABS: 650.0000 mg | ORAL_TABLET | ORAL | Status: DC | PRN

## 2012-08-30 MED ORDER — NEOSTIGMINE METHYLSULFATE 1 MG/ML IJ SOLN
INTRAMUSCULAR | Status: DC | PRN
  Administered 2012-08-30: 4 mg via INTRAVENOUS

## 2012-08-30 MED ORDER — FENTANYL CITRATE 0.05 MG/ML IJ SOLN
INTRAMUSCULAR | Status: DC | PRN
  Administered 2012-08-30 (×6): 50 ug via INTRAVENOUS

## 2012-08-30 MED ORDER — ALPRAZOLAM 0.25 MG PO TABS
0.2500 mg | ORAL_TABLET | Freq: Every day | ORAL | Status: DC | PRN
  Administered 2012-08-30: 0.25 mg via ORAL
  Filled 2012-08-30: qty 1

## 2012-08-30 MED ORDER — HYDROMORPHONE HCL PF 1 MG/ML IJ SOLN
0.2500 mg | INTRAMUSCULAR | Status: DC | PRN
  Administered 2012-08-30 (×4): 0.5 mg via INTRAVENOUS

## 2012-08-30 MED ORDER — HYDROMORPHONE HCL PF 1 MG/ML IJ SOLN
1.0000 mg | INTRAMUSCULAR | Status: DC | PRN
  Administered 2012-08-30 (×2): 1 mg via INTRAVENOUS
  Filled 2012-08-30 (×2): qty 1

## 2012-08-30 MED ORDER — METOCLOPRAMIDE HCL 5 MG/ML IJ SOLN
INTRAMUSCULAR | Status: DC | PRN
  Administered 2012-08-30: 10 mg via INTRAVENOUS

## 2012-08-30 MED ORDER — LACTATED RINGERS IV SOLN
INTRAVENOUS | Status: DC
  Administered 2012-08-30: 1000 mL via INTRAVENOUS
  Administered 2012-08-30: 12:00:00 via INTRAVENOUS

## 2012-08-30 MED ORDER — LEVOTHYROXINE SODIUM 137 MCG PO TABS
137.0000 ug | ORAL_TABLET | Freq: Every day | ORAL | Status: DC
  Administered 2012-08-31: 137 ug via ORAL
  Filled 2012-08-30 (×2): qty 1

## 2012-08-30 MED ORDER — LIDOCAINE HCL (CARDIAC) 20 MG/ML IV SOLN
INTRAVENOUS | Status: DC | PRN
  Administered 2012-08-30: 100 mg via INTRAVENOUS

## 2012-08-30 MED ORDER — MIDAZOLAM HCL 5 MG/5ML IJ SOLN
INTRAMUSCULAR | Status: DC | PRN
  Administered 2012-08-30: 2 mg via INTRAVENOUS

## 2012-08-30 MED ORDER — 0.9 % SODIUM CHLORIDE (POUR BTL) OPTIME
TOPICAL | Status: DC | PRN
  Administered 2012-08-30: 1000 mL

## 2012-08-30 SURGICAL SUPPLY — 38 items
ATTRACTOMAT 16X20 MAGNETIC DRP (DRAPES) ×2 IMPLANT
BENZOIN TINCTURE PRP APPL 2/3 (GAUZE/BANDAGES/DRESSINGS) ×2 IMPLANT
BLADE HEX COATED 2.75 (ELECTRODE) ×2 IMPLANT
BLADE SURG 15 STRL LF DISP TIS (BLADE) ×1 IMPLANT
BLADE SURG 15 STRL SS (BLADE) ×1
CANISTER SUCTION 2500CC (MISCELLANEOUS) ×2 IMPLANT
CHLORAPREP W/TINT 10.5 ML (MISCELLANEOUS) ×2 IMPLANT
CLIP TI MEDIUM 6 (CLIP) ×8 IMPLANT
CLIP TI WIDE RED SMALL 6 (CLIP) ×10 IMPLANT
CLOTH BEACON ORANGE TIMEOUT ST (SAFETY) ×2 IMPLANT
DISSECTOR ROUND CHERRY 3/8 STR (MISCELLANEOUS) IMPLANT
DRAPE PED LAPAROTOMY (DRAPES) ×2 IMPLANT
DRESSING SURGICEL FIBRLLR 1X2 (HEMOSTASIS) ×1 IMPLANT
DRSG SURGICEL FIBRILLAR 1X2 (HEMOSTASIS) ×2
ELECT REM PT RETURN 9FT ADLT (ELECTROSURGICAL) ×2
ELECTRODE REM PT RTRN 9FT ADLT (ELECTROSURGICAL) ×1 IMPLANT
GAUZE SPONGE 4X4 16PLY XRAY LF (GAUZE/BANDAGES/DRESSINGS) ×2 IMPLANT
GLOVE SURG ORTHO 8.0 STRL STRW (GLOVE) ×2 IMPLANT
GOWN STRL NON-REIN LRG LVL3 (GOWN DISPOSABLE) ×2 IMPLANT
GOWN STRL REIN XL XLG (GOWN DISPOSABLE) ×4 IMPLANT
KIT BASIN OR (CUSTOM PROCEDURE TRAY) ×2 IMPLANT
NS IRRIG 1000ML POUR BTL (IV SOLUTION) IMPLANT
PACK BASIC VI WITH GOWN DISP (CUSTOM PROCEDURE TRAY) ×2 IMPLANT
PENCIL BUTTON HOLSTER BLD 10FT (ELECTRODE) ×2 IMPLANT
SHEARS HARMONIC 9CM CVD (BLADE) ×2 IMPLANT
SPONGE GAUZE 4X4 12PLY (GAUZE/BANDAGES/DRESSINGS) ×2 IMPLANT
STAPLER VISISTAT 35W (STAPLE) ×2 IMPLANT
STRIP CLOSURE SKIN 1/2X4 (GAUZE/BANDAGES/DRESSINGS) ×2 IMPLANT
SUT MNCRL AB 4-0 PS2 18 (SUTURE) ×2 IMPLANT
SUT SILK 2 0 (SUTURE) ×1
SUT SILK 2-0 18XBRD TIE 12 (SUTURE) ×1 IMPLANT
SUT SILK 3 0 (SUTURE)
SUT SILK 3-0 18XBRD TIE 12 (SUTURE) IMPLANT
SUT VIC AB 3-0 SH 18 (SUTURE) ×2 IMPLANT
SYR BULB IRRIGATION 50ML (SYRINGE) ×2 IMPLANT
TAPE CLOTH SURG 4X10 WHT LF (GAUZE/BANDAGES/DRESSINGS) ×2 IMPLANT
TOWEL OR 17X26 10 PK STRL BLUE (TOWEL DISPOSABLE) ×2 IMPLANT
YANKAUER SUCT BULB TIP 10FT TU (MISCELLANEOUS) ×2 IMPLANT

## 2012-08-30 NOTE — Anesthesia Preprocedure Evaluation (Signed)
Anesthesia Evaluation  Patient identified by MRN, date of birth, ID band Patient awake    Reviewed: Allergy & Precautions, H&P , NPO status , Patient's Chart, lab work & pertinent test results  Airway Mallampati: II TM Distance: >3 FB Neck ROM: Full    Dental no notable dental hx.    Pulmonary neg pulmonary ROS,  breath sounds clear to auscultation  Pulmonary exam normal       Cardiovascular negative cardio ROS  Rhythm:Regular Rate:Normal     Neuro/Psych negative neurological ROS  negative psych ROS   GI/Hepatic negative GI ROS, Neg liver ROS,   Endo/Other  Hypothyroidism   Renal/GU negative Renal ROS  negative genitourinary   Musculoskeletal negative musculoskeletal ROS (+)   Abdominal   Peds negative pediatric ROS (+)  Hematology negative hematology ROS (+)   Anesthesia Other Findings   Reproductive/Obstetrics negative OB ROS                           Anesthesia Physical Anesthesia Plan  ASA: II  Anesthesia Plan: General   Post-op Pain Management:    Induction: Intravenous  Airway Management Planned: Oral ETT  Additional Equipment:   Intra-op Plan:   Post-operative Plan: Extubation in OR  Informed Consent: I have reviewed the patients History and Physical, chart, labs and discussed the procedure including the risks, benefits and alternatives for the proposed anesthesia with the patient or authorized representative who has indicated his/her understanding and acceptance.   Dental advisory given  Plan Discussed with: CRNA and Surgeon  Anesthesia Plan Comments:         Anesthesia Quick Evaluation  

## 2012-08-30 NOTE — H&P (Signed)
[name] is an 27 y.o. female.    General Surgery [facility]  Chief Complaint: thyroid nodule with indeterminate cytopathology  HPI: Patient is a 27 year old female referred by her primary care physician with newly identified thyroid nodule. Patient has a history of hypothyroidism. She has been on Synthroid for 10 years. She has had a difficult time regulating her dosage of thyroid hormone replacement. Patient underwent a thyroid ultrasound in June 2014. This shows a slightly enlarged thyroid gland which is in homogeneity. There was a poorly defined 1.6 cm nodule in the right thyroid lobe. In July 2014 she underwent ultrasound-guided fine needle aspiration biopsy. This shows a follicular lesion of undetermined significance. Patient is referred to general surgery for evaluation and recommendations at this time. No family history is available as the patient is adopted. She has had no prior surgery on the head or neck. She has no history of other endocrinopathy.  Past Medical History  Diagnosis Date  . Thyroid disease   . Asthma   . Gastric ulcer   . Depression   . Hypothyroidism   . Anemia     Past Surgical History  Procedure Laterality Date  . Tonsillectomy  age 89  . Wisdom tooth extraction  age 38  . Cholecystectomy      Family History  Problem Relation Age of Onset  . Adopted: Yes  . Diabetes Father    Social History:  reports that she quit smoking about 4 years ago. She does not have any smokeless tobacco history on file. She reports that  drinks alcohol. She reports that she does not use illicit drugs.  Allergies:  Allergies  Allergen Reactions  . Adhesive [Tape] Other (See Comments)    " bereaks out"     Medications Prior to Admission  Medication Sig Dispense Refill  . ALPRAZolam (XANAX) 0.25 MG tablet Take 0.25 mg by mouth daily as needed for anxiety.      . Cholecalciferol (VITAMIN D3) 5000 UNITS TABS Take 5,000 Units by mouth every other  day.      . fexofenadine (ALLEGRA) 180 MG tablet Take 180 mg by mouth daily as needed (allergies).      Marland Kitchen levothyroxine (SYNTHROID, LEVOTHROID) 137 MCG tablet Take 137 mcg by mouth daily before breakfast.      . Melatonin 3 MG CAPS Take 3-6 mg by mouth at bedtime as needed (for sleep).      . sertraline (ZOLOFT) 50 MG tablet Take 50 mg by mouth at bedtime.         No results found for this or any previous visit (from the past 48 hour(s)). No results found.  Review of Systems  Constitutional: Negative.   HENT: Negative.   Eyes: Negative.   Respiratory: Negative.   Cardiovascular: Negative.   Gastrointestinal: Negative.   Genitourinary: Negative.   Musculoskeletal: Negative.   Skin: Negative.   Neurological: Negative.   Endo/Heme/Allergies: Negative.   Psychiatric/Behavioral: Negative.     Blood pressure 120/73, pulse 99, temperature 98.1 F (36.7 C), temperature source Oral, resp. rate 20, last menstrual period 08/16/2012, SpO2 98.00%. Physical Exam  Constitutional: She is oriented to person, place, and time. She appears well-developed and well-nourished. No distress.  HENT:  Head: Normocephalic and atraumatic.  Right Ear: External ear normal.  Left Ear: External ear normal.  Eyes: Conjunctivae are normal. Pupils are equal, round, and reactive to light. No scleral icterus.  Neck: Normal range of motion. Neck supple. No tracheal deviation present.  Subtle thyroid nodule palpable  Cardiovascular: Normal rate, regular rhythm and normal heart sounds.   No murmur heard. Respiratory: Effort normal and breath sounds normal. No respiratory distress.  GI: Soft. Bowel sounds are normal. She exhibits no distension.  Musculoskeletal: Normal range of motion. She exhibits no edema.  Lymphadenopathy:    She has no cervical adenopathy.  Neurological: She is alert and oriented to person, place, and time.  Skin: Skin is warm and dry.  Psychiatric: She has a normal mood and affect. Her  behavior is normal.     Assessment/Plan Thyroid nodule with indeterminate cytopathology, hypothyroidism  Plan total thyroidectomy  The risks and benefits of the procedure have been discussed at length with the patient.  The patient understands the proposed procedure, potential alternative treatments, and the course of recovery to be expected.  All of the patient's questions have been answered at this time.  The patient wishes to proceed with surgery.  Velora Heckler, MD, FACS General & Endocrine Surgery [facility] Office: [phone]   Sora Olivo Judie Petit 08/30/2012, 10:10 AM

## 2012-08-30 NOTE — Brief Op Note (Signed)
08/30/2012  12:50 PM  PATIENT:  [name]  27 y.o. female  PRE-OPERATIVE DIAGNOSIS:  thyroid nodule of uncertain significance   POST-OPERATIVE DIAGNOSIS:  thyroid nodule of uncertain significance   PROCEDURE:  Procedure(s): THYROIDECTOMY (N/A)  SURGEON:  Surgeon(s) and Role:    * Velora Heckler, MD - Primary  ANESTHESIA:   general  EBL:  Total I/O In: 1000 [I.V.:1000] Out: -   BLOOD ADMINISTERED:none  DRAINS: none   LOCAL MEDICATIONS USED:  NONE  SPECIMEN:  Excision  DISPOSITION OF SPECIMEN:  PATHOLOGY  COUNTS:  YES  TOURNIQUET:  * No tourniquets in log *  DICTATION: .Other Dictation: Dictation Number [id number]  PLAN OF CARE: Admit for overnight observation  PATIENT DISPOSITION:  PACU - hemodynamically stable.   Delay start of Pharmacological VTE agent (>24hrs) due to surgical blood loss or risk of bleeding: yes  Velora Heckler, MD, FACS General & Endocrine Surgery [facility] Office: [phone]

## 2012-08-30 NOTE — Anesthesia Postprocedure Evaluation (Signed)
  Anesthesia Post-op Note  Patient: [name]  Procedure(s) Performed: Procedure(s) (LRB): THYROIDECTOMY (N/A)  Patient Location: PACU  Anesthesia Type: General  Level of Consciousness: awake and alert   Airway and Oxygen Therapy: Patient Spontanous Breathing  Post-op Pain: mild  Post-op Assessment: Post-op Vital signs reviewed, Patient's Cardiovascular Status Stable, Respiratory Function Stable, Patent Airway and No signs of Nausea or vomiting  Last Vitals:  Filed Vitals:   08/30/12 1345  BP: 150/84  Pulse: 90  Temp:   Resp: 15    Post-op Vital Signs: stable   Complications: No apparent anesthesia complications

## 2012-08-30 NOTE — Transfer of Care (Signed)
Immediate Anesthesia Transfer of Care Note  Patient: [name]  Procedure(s) Performed: Procedure(s): THYROIDECTOMY (N/A)  Patient Location: PACU  Anesthesia Type:General  Level of Consciousness: awake, oriented and patient cooperative  Airway & Oxygen Therapy: Patient Spontanous Breathing and Patient connected to face mask oxygen  Post-op Assessment: Report given to PACU RN and Post -op Vital signs reviewed and stable  Post vital signs: Reviewed  Complications: No apparent anesthesia complications

## 2012-08-30 NOTE — Anesthesia Postprocedure Evaluation (Signed)
  Anesthesia Post-op Note  Patient: [name]  Procedure(s) Performed: Procedure(s) (LRB): THYROIDECTOMY (N/A)  Patient Location: PACU  Anesthesia Type: General  Level of Consciousness: awake and alert   Airway and Oxygen Therapy: Patient Spontanous Breathing  Post-op Pain: mild  Post-op Assessment: Post-op Vital signs reviewed, Patient's Cardiovascular Status Stable, Respiratory Function Stable, Patent Airway and No signs of Nausea or vomiting  Last Vitals:  Filed Vitals:   08/30/12 1345  BP: 150/84  Pulse: 90  Temp:   Resp: 15    Post-op Vital Signs: stable   Complications: No apparent anesthesia complications 

## 2012-08-30 NOTE — Preoperative (Signed)
Beta Blockers   Reason not to administer Beta Blockers:Not Applicable 

## 2012-08-31 LAB — BASIC METABOLIC PANEL
BUN: 8 mg/dL (ref 6–23)
Calcium: 9 mg/dL (ref 8.4–10.5)
Chloride: 99 mEq/L (ref 96–112)
Creatinine, Ser: 0.7 mg/dL (ref 0.50–1.10)
GFR calc Af Amer: >90 mL/min (ref >90)

## 2012-08-31 MED ORDER — CALCIUM CARBONATE 1250 (500 CA) MG PO TABS: 2.0000 | ORAL_TABLET | Freq: Three times a day (TID) | ORAL | Status: AC

## 2012-08-31 MED ORDER — HYDROCODONE-ACETAMINOPHEN 5-325 MG PO TABS: 1.0000 | ORAL_TABLET | ORAL | Status: DC | PRN

## 2012-08-31 NOTE — Progress Notes (Signed)
1 Day Post-Op  Subjective: Doing well.  Voice a little scratchy but pain controlled.  No numbness, weakness, or spasms, or tingling  Objective: Vital signs in last 24 hours: Temp:  [97.5 F (36.4 C)-98.7 F (37.1 C)] 98.3 F (36.8 C) (08/30 1000) Pulse Rate:  [68-104] 77 (08/30 1000) Resp:  [10-28] 18 (08/30 1000) BP: (105-150)/(59-88) 118/68 mmHg (08/30 1000) SpO2:  [94 %-100 %] 98 % (08/30 1000) Weight:  [218 lb 6.4 oz (99.066 kg)] 218 lb 6.4 oz (99.066 kg) (08/30 0100) Last BM Date: 08/29/12  Intake/Output from previous day: 08/29 0701 - 08/30 0700 In: 3616.7 [P.O.:840; I.V.:2776.7] Out: 3020 [Urine:3000; Blood:20] Intake/Output this shift:    General appearance: alert, cooperative and no distress Neck: incision looks fine, no sign of infection, no swelling or sign of hematoma, no evidence of hypocalcemia Resp: nonlabored Cardio: normal rate, regular  Lab Results:  No results found for this basename: WBC, HGB, HCT, PLT,  in the last 72 hours BMET  Recent Labs  08/31/12 0418  NA 133*  K 3.9  CL 99  CO2 27  GLUCOSE 111*  BUN 8  CREATININE 0.70  CALCIUM 9.0   PT/INR No results found for this basename: LABPROT, INR,  in the last 72 hours ABG No results found for this basename: PHART, PCO2, PO2, HCO3,  in the last 72 hours  Studies/Results: No results found.  Anti-infectives: Anti-infectives   Start     Dose/Rate Route Frequency Ordered Stop   08/30/12 0807  ceFAZolin (ANCEF) IVPB 2 g/50 mL premix     2 g 100 mL/hr over 30 Minutes Intravenous On call to O.R. 08/30/12 0807 08/30/12 1055      Assessment/Plan: s/p Procedure(s): THYROIDECTOMY (N/A) she seems to be doing okay without sign of postop complication.  should be okay for discharge today  LOS: 1 day    Lodema Pilot DAVID 08/31/2012

## 2012-08-31 NOTE — Op Note (Signed)
[name], [name]              ACCOUNT NO.:  [id number]  MEDICAL RECORD NO.:  [id number]  LOCATION:  1528                         FACILITY:  [facility]  PHYSICIAN:  Velora Heckler, MD      DATE OF BIRTH:  09/01/1985  DATE OF PROCEDURE:  08/30/2012                               OPERATIVE REPORT   PREOPERATIVE DIAGNOSIS:  Thyroid nodule with indeterminate cytopathology.  POSTOPERATIVE DIAGNOSIS:  Thyroid nodule with indeterminate cytopathology.  PROCEDURE:  Total thyroidectomy.  SURGEON:  Velora Heckler, MD, FACS  ANESTHESIA:  General per Dr. Eilene Ghazi.  ESTIMATED BLOOD LOSS:  Minimal.  PREPARATION:  ChloraPrep.  COMPLICATIONS:  None.  INDICATIONS:  The patient is a 27 year old female referred by her primary care physician with a newly identified thyroid nodule.  The patient has a history of hypothyroidism.  She underwent thyroid ultrasound showing an enlarged thyroid gland that was inhomogeneous. There was a poorly defined 1.6-cm nodule in the right thyroid lobe. Fine needle aspiration showed a follicular lesion of undetermined significance.  The patient was referred to General Surgery for consideration of her thyroidectomy.  BODY OF REPORT:  Procedure was done in OR #11 at the [facility].  The patient was brought to the operating room and placed in supine position on the operating room table.  Following administration of general anesthesia, the patient was positioned and then prepped and draped in the usual strict aseptic fashion.  After ascertaining that, an adequate level of anesthesia had been achieved, a Kocher incision was made with a #15 blade.  Dissection was carried through subcutaneous tissues and platysma.  Hemostasis was obtained with electrocautery.  Skin flaps were elevated cephalad and caudad from the thyroid notch to the sternal notch.  A Mahorner self-retaining retractor was placed for exposure.  Strap muscles were incised in the  midline and dissection was begun on the left side.  Left thyroid lobe was quite firm.  It was slightly enlarged.  There was a nodule somewhat extrinsic to the thyroid capsule in the inferior pole.  The entire lobe was gently mobilized with blunt dissection.  Venous tributaries were divided between Ligaclips with the Harmonic scalpel.  Superior pole vessels were dissected out individually and divided between Ligaclips with the Harmonic scalpel.  Gland was rolled anteriorly.  Branches of the inferior thyroid artery were divided between small Ligaclips with the Harmonic scalpel.  Recurrent laryngeal nerve was identified and preserved.  Gland was rolled anteriorly.  Ligament of Allyson Sabal was released with the electrocautery and the gland was mobilized onto the anterior trachea.  Isthmus was mobilized across the midline.  There was no significant pyramidal lobe identified.  Dry pack was placed in the left neck.  Next, we turned our attention to the right thyroid lobe.  Again, the lobe was gently mobilized with blunt dissection.  Superior pole was dissected out and superior pole vessel was divided between small and medium Ligaclips with the Harmonic scalpel.  Inferior venous tributaries were divided between Ligaclips with the Harmonic scalpel.  Gland was rolled anteriorly.  Branches of the inferior thyroid artery were dissected out and divided between Ligaclips with the Harmonic scalpel. Recurrent nerve was identified  and preserved.  Ligament of Allyson Sabal was released with the electrocautery and the gland was mobilized onto the anterior trachea.  The entire gland was excised off the anterior trachea using the electrocautery for hemostasis.  Suture was used to mark the right superior pole of the specimen.  The entire thyroid gland was submitted to Pathology for review.  The neck was irrigated with warm saline.  Good hemostasis was noted throughout.  Palpation along the jugular veins bilaterally  shows no evidence of abnormal lymph nodes.  Palpation in the central compartment shows no lymphadenopathy.  Fibrillar was placed throughout the operative field.  Strap muscles were reapproximated in the midline with interrupted 3-0 Vicryl sutures. Platysma was closed with interrupted 3-0 Vicryl sutures.  Skin was closed with a running 4-0 Monocryl subcuticular suture.  Wound was washed and dried, and benzoin and Steri-Strips were applied.  Sterile dressings were applied.  The patient was awakened from anesthesia and brought to the recovery room.  The patient tolerated the procedure well.   Velora Heckler, MD, FACS General & Endocrine Surgery [facility] Office: [phone]   TMG/MEDQ  D:  08/30/2012  T:  08/31/2012  Job:  [id number]  cc:   Tonita Cong, M.D. Fax: [phone]  Dossie Der, MD

## 2012-09-03 ENCOUNTER — Encounter (HOSPITAL_COMMUNITY): Payer: Self-pay | Admitting: Surgery

## 2012-09-03 ENCOUNTER — Telehealth (INDEPENDENT_AMBULATORY_CARE_PROVIDER_SITE_OTHER): Payer: Self-pay

## 2012-09-03 ENCOUNTER — Encounter (INDEPENDENT_AMBULATORY_CARE_PROVIDER_SITE_OTHER): Payer: Self-pay

## 2012-09-03 ENCOUNTER — Other Ambulatory Visit (INDEPENDENT_AMBULATORY_CARE_PROVIDER_SITE_OTHER): Payer: Self-pay

## 2012-09-03 NOTE — Telephone Encounter (Signed)
Pt home doing well. PO appt made. RTW note mailed to pt with lab slip per pt request.

## 2012-09-11 ENCOUNTER — Ambulatory Visit: Payer: Managed Care, Other (non HMO) | Admitting: Dietician

## 2012-09-18 ENCOUNTER — Encounter (INDEPENDENT_AMBULATORY_CARE_PROVIDER_SITE_OTHER): Payer: Self-pay | Admitting: Surgery

## 2012-09-18 ENCOUNTER — Ambulatory Visit (INDEPENDENT_AMBULATORY_CARE_PROVIDER_SITE_OTHER): Payer: Commercial Indemnity | Admitting: Surgery

## 2012-09-18 ENCOUNTER — Encounter (INDEPENDENT_AMBULATORY_CARE_PROVIDER_SITE_OTHER): Payer: Self-pay

## 2012-09-18 VITALS — BP 118/72 | HR 68 | Resp 14 | Ht 65.5 in | Wt 217.0 lb

## 2012-09-18 DIAGNOSIS — C73 Malignant neoplasm of thyroid gland: Secondary | ICD-10-CM

## 2012-09-18 DIAGNOSIS — D449 Neoplasm of uncertain behavior of unspecified endocrine gland: Secondary | ICD-10-CM

## 2012-09-18 DIAGNOSIS — E039 Hypothyroidism, unspecified: Secondary | ICD-10-CM

## 2012-09-18 DIAGNOSIS — D440 Neoplasm of uncertain behavior of thyroid gland: Secondary | ICD-10-CM

## 2012-09-18 NOTE — Patient Instructions (Signed)
  COCOA BUTTER & VITAMIN E CREAM  ([employer]mer[employer] or other brand)  Apply cocoa butter/vitamin E cream to your incision 2 - 3 times daily.  Massage cream into incision for one minute with each application.  Use sunscreen (50 SPF or higher) for first 6 months after surgery if area is exposed to sun.  You may substitute Mederma or other scar reducing creams as desired.   

## 2012-09-18 NOTE — Progress Notes (Signed)
General Surgery [facility]  Chief Complaint  Patient presents with  . 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 Patient is scheduled to see her endocrinologist tomorrow to discuss treatment with radioactive iodine. She has remained on her preoperative dosage of Synthroid at 137 mcg daily.  EXAM: Surgical incision is healing without complication. There is a small separation of the dermis at the right extent of the incision. There is no sign of infection. There is no sign of seroma. Was quality is normal.  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 Debara Pickett tomorrow. He has already performed her laboratory studies. They will discuss radioactive iodine treatment.  Patient will begin applying topical creams to her incision. She will return for wound check in 6 weeks.  Velora Heckler, MD, FACS General & Endocrine Surgery [facility]   Visit Diagnoses: 1. Hypothyroidism   2. Neoplasm of uncertain behavior of thyroid gland, right lobe   3. Thyroid carcinoma, follicular variant of papillary, 1.4 cm

## 2012-09-29 NOTE — Discharge Summary (Signed)
Physician Discharge Summary [facility]  Patient ID: [name] MRN: [id number] DOB/AGE: [birth year] 27 y.o.  Admit date: 08/30/2012 Discharge date: 08/31/2012   Admission Diagnoses:  Thyroid nodules with atypia  Discharge Diagnoses:  Principal Problem:   Neoplasm of uncertain behavior of thyroid gland, right lobe   Discharged Condition: good  Hospital Course: The patient is a 27 year old female referred by her  primary care physician with a newly identified thyroid nodule. The  patient has a history of hypothyroidism. She underwent thyroid  ultrasound showing an enlarged thyroid gland that was inhomogeneous.  There was a poorly defined 1.6-cm nodule in the right thyroid lobe.  Fine needle aspiration showed a follicular lesion of undetermined  significance. The patient was referred to General Surgery for  consideration of her thyroidectomy.  Patient was admitted for observation following total thyroidectomy.  Post op course was uncomplicated and pain was well-controlled.  Prepared for discharge home on POD#1.  Consults: None  Significant Diagnostic Studies: labs: calcium  Treatments: surgery: total thyroidectomy  Discharge Exam: Blood pressure 118/68, pulse 77, temperature 98.3 F (36.8 C), temperature source Oral, resp. rate 18, height 5\' 6"  (1.676 m), weight 218 lb 6.4 oz (99.066 kg), last menstrual period 08/16/2012, SpO2 98.00%. See Dr. Biagio Quint note from 08/31/2012  Disposition: Home with family  Discharge Orders   Future Appointments Provider Department Dept Phone   11/26/2012 3:15 PM Velora Heckler, MD [facility] [place] [phone]   Future Orders Complete By Expires   Call MD for:  difficulty breathing, headache or visual disturbances  As directed    Call MD for:  extreme fatigue  As directed    Call MD for:  persistant dizziness or light-headedness  As directed    Call MD for:  persistant nausea and vomiting  As directed    Call  MD for:  redness, tenderness, or signs of infection (pain, swelling, redness, odor or green/yellow discharge around incision site)  As directed    Call MD for:  severe uncontrolled pain  As directed    Call MD for:  temperature >100.4  As directed    Diet - low sodium heart healthy  As directed    Discharge instructions  As directed    Comments:     May shower tomorrow. Call [phone] to schedule any follow up appointment and labs with Dr. Gerrit Friends Take your calcium as prescribed and return to the ER if any weakness, tingling, numbness, or muscle spasms or with any neck swelling or difficulty breathing   Increase activity slowly  As directed        Medication List         ALPRAZolam 0.25 MG tablet  Commonly known as:  XANAX  Take 0.25 mg by mouth daily as needed for anxiety.     calcium carbonate 1250 MG tablet  Commonly known as:  OS-CAL - dosed in mg of elemental calcium  Take 2 tablets (1,000 mg of elemental calcium total) by mouth 3 (three) times daily with meals.     fexofenadine 180 MG tablet  Commonly known as:  ALLEGRA  Take 180 mg by mouth daily as needed (allergies).     levothyroxine 137 MCG tablet  Commonly known as:  SYNTHROID, LEVOTHROID  Take 137 mcg by mouth daily before breakfast.     Melatonin 3 MG Caps  Take 3-6 mg by mouth at bedtime as needed (for sleep).     sertraline 50 MG tablet  Commonly known  as:  ZOLOFT  Take 50 mg by mouth at bedtime.     Vitamin D3 5000 UNITS Tabs  Take 5,000 Units by mouth every other day.           Follow-up Information   Follow up with Velora Heckler, MD. Schedule an appointment as soon as possible for a visit in 3 weeks.   Specialty:  General Surgery   Contact information:   [address] [place] [address] [phone]       Velora Heckler, MD, [facility] Office: [phone]   Signed: Velora Heckler 09/29/2012, 7:09 AM

## 2012-10-07 ENCOUNTER — Ambulatory Visit (INDEPENDENT_AMBULATORY_CARE_PROVIDER_SITE_OTHER): Payer: Commercial Indemnity | Admitting: Surgery

## 2012-10-07 ENCOUNTER — Other Ambulatory Visit (HOSPITAL_COMMUNITY): Payer: Self-pay | Admitting: Internal Medicine

## 2012-10-07 ENCOUNTER — Other Ambulatory Visit (INDEPENDENT_AMBULATORY_CARE_PROVIDER_SITE_OTHER): Payer: Self-pay | Admitting: Surgery

## 2012-10-07 ENCOUNTER — Encounter (INDEPENDENT_AMBULATORY_CARE_PROVIDER_SITE_OTHER): Payer: Self-pay | Admitting: Surgery

## 2012-10-07 VITALS — BP 118/76 | HR 76 | Temp 98.9°F | Resp 14 | Ht 66.0 in | Wt 216.4 lb

## 2012-10-07 DIAGNOSIS — Z9889 Other specified postprocedural states: Secondary | ICD-10-CM

## 2012-10-07 DIAGNOSIS — M542 Cervicalgia: Secondary | ICD-10-CM

## 2012-10-07 DIAGNOSIS — C73 Malignant neoplasm of thyroid gland: Secondary | ICD-10-CM

## 2012-10-07 MED ORDER — DOXYCYCLINE HYCLATE 100 MG PO TABS: 100.0000 mg | ORAL_TABLET | Freq: Two times a day (BID) | ORAL | Status: DC

## 2012-10-07 NOTE — Progress Notes (Signed)
Subjective:     Patient ID: [name], female   DOB: 1986-01-02, 27 y.o.   MRN: [id number]  HPI This is a patient of Dr. Ardine Eng.  She is status post a total thyroidectomy for thyroid cancer over a month ago. She started developing swelling at her incision over the weekend and a fever of 100. She also reports some mild discomfort swallowing.  Review of Systems     Objective:   Physical Exam On exam, there is some mild swelling of the incision but no erythema of the incision. It is not particularly tender. Her oropharynx is clear in her lungs are clear    Assessment:     Fever of unknown etiology     Plan:     Because all of her discomfort is at the thyroid incision and she reports it is now swallowing, I am going to get an ultrasound to see if there is an underlying fluid collection. I will also empirically place her on doxycycline. I will have her follow up with Dr. Gerrit Friends next week unless she acutely worsens

## 2012-10-10 ENCOUNTER — Ambulatory Visit
Admission: RE | Admit: 2012-10-10 | Discharge: 2012-10-10 | Disposition: A | Discharge status: Home / Self Care | Payer: BC Managed Care – PPO | Source: Ambulatory Visit | Attending: Surgery | Admitting: Surgery

## 2012-10-10 DIAGNOSIS — M542 Cervicalgia: Secondary | ICD-10-CM

## 2012-10-10 DIAGNOSIS — Z9889 Other specified postprocedural states: Secondary | ICD-10-CM

## 2012-10-16 ENCOUNTER — Ambulatory Visit (HOSPITAL_COMMUNITY): Payer: Managed Care, Other (non HMO)

## 2012-10-16 ENCOUNTER — Encounter (INDEPENDENT_AMBULATORY_CARE_PROVIDER_SITE_OTHER): Payer: Self-pay

## 2012-10-16 ENCOUNTER — Ambulatory Visit (INDEPENDENT_AMBULATORY_CARE_PROVIDER_SITE_OTHER): Payer: Commercial Indemnity | Admitting: Surgery

## 2012-10-16 ENCOUNTER — Encounter (INDEPENDENT_AMBULATORY_CARE_PROVIDER_SITE_OTHER): Payer: Self-pay | Admitting: Surgery

## 2012-10-16 VITALS — BP 124/68 | HR 68 | Temp 99.1°F | Resp 15 | Ht 66.0 in | Wt 215.6 lb

## 2012-10-16 DIAGNOSIS — E039 Hypothyroidism, unspecified: Secondary | ICD-10-CM

## 2012-10-16 DIAGNOSIS — C73 Malignant neoplasm of thyroid gland: Secondary | ICD-10-CM

## 2012-10-16 NOTE — Progress Notes (Signed)
General Surgery [facility]  Chief Complaint  Patient presents with  . Routine Post Op    total thyroidectomy 08/30/2012    HISTORY: Patient is a 27 year old female who underwent total thyroidectomy for papillary thyroid carcinoma in August 2014. She was seen 1 week ago by my partner. She had suddenly developed a small seroma in the anterior neck. An ultrasound was performed.  EXAM: Surgical incision is well-healed. Palpation shows mild soft tissue swelling.  No palpable masses. No sign of infection. Voice quality is normal.  IMPRESSION: Papillary thyroid carcinoma  PLAN: Patient is scheduled for radioactive iodine treatment in early December. Her treatment has been delayed due to her upcoming marriage.  Patient is currently taking Synthroid 175 mcg daily under the direction of her endocrinologist.  Patient will return for evaluation in 4 months.  Velora Heckler, MD, FACS General & Endocrine Surgery [facility]   Visit Diagnoses: 1. Hypothyroidism   2. Thyroid carcinoma, follicular variant of papillary, 1.4 cm

## 2012-10-16 NOTE — Patient Instructions (Signed)
  COCOA BUTTER & VITAMIN E CREAM  ([employer]mer[employer] or other brand)  Apply cocoa butter/vitamin E cream to your incision 2 - 3 times daily.  Massage cream into incision for one minute with each application.  Use sunscreen (50 SPF or higher) for first 6 months after surgery if area is exposed to sun.  You may substitute Mederma or other scar reducing creams as desired.   

## 2012-10-17 ENCOUNTER — Ambulatory Visit (HOSPITAL_COMMUNITY): Payer: Managed Care, Other (non HMO)

## 2012-10-18 ENCOUNTER — Encounter (HOSPITAL_COMMUNITY): Payer: Managed Care, Other (non HMO)

## 2012-10-28 ENCOUNTER — Encounter (HOSPITAL_COMMUNITY): Payer: Managed Care, Other (non HMO)

## 2012-11-26 ENCOUNTER — Encounter (INDEPENDENT_AMBULATORY_CARE_PROVIDER_SITE_OTHER): Payer: Commercial Indemnity | Admitting: Surgery

## 2012-11-26 ENCOUNTER — Telehealth (INDEPENDENT_AMBULATORY_CARE_PROVIDER_SITE_OTHER): Payer: Self-pay

## 2012-11-26 NOTE — Telephone Encounter (Signed)
LMOM re: missed appt today. Pt being followed by Dr Sharl Ma.

## 2012-11-27 ENCOUNTER — Encounter (INDEPENDENT_AMBULATORY_CARE_PROVIDER_SITE_OTHER): Payer: Self-pay | Admitting: Surgery

## 2012-12-02 ENCOUNTER — Encounter (HOSPITAL_COMMUNITY)
Admission: RE | Admit: 2012-12-02 | Discharge: 2012-12-02 | Disposition: A | Discharge status: Home / Self Care | Payer: Managed Care, Other (non HMO) | Source: Ambulatory Visit | Attending: Internal Medicine | Admitting: Internal Medicine

## 2012-12-02 DIAGNOSIS — C73 Malignant neoplasm of thyroid gland: Secondary | ICD-10-CM | POA: Insufficient documentation

## 2012-12-02 MED ORDER — THYROTROPIN ALFA 1.1 MG IM SOLR
0.9000 mg | INTRAMUSCULAR | Status: AC
  Administered 2012-12-02: 0.9 mg via INTRAMUSCULAR
  Filled 2012-12-02: qty 0.9

## 2012-12-03 ENCOUNTER — Encounter (HOSPITAL_COMMUNITY)
Admission: RE | Admit: 2012-12-03 | Discharge: 2012-12-03 | Disposition: A | Discharge status: Home / Self Care | Payer: Managed Care, Other (non HMO) | Source: Ambulatory Visit | Attending: Internal Medicine | Admitting: Internal Medicine

## 2012-12-03 DIAGNOSIS — C73 Malignant neoplasm of thyroid gland: Secondary | ICD-10-CM | POA: Insufficient documentation

## 2012-12-03 MED ORDER — THYROTROPIN ALFA 1.1 MG IM SOLR
0.9000 mg | INTRAMUSCULAR | Status: AC
  Administered 2012-12-03: 0.9 mg via INTRAMUSCULAR
  Filled 2012-12-03: qty 0.9

## 2012-12-04 ENCOUNTER — Ambulatory Visit (HOSPITAL_COMMUNITY)
Admission: RE | Admit: 2012-12-04 | Discharge: 2012-12-04 | Disposition: A | Discharge status: Home / Self Care | Payer: Managed Care, Other (non HMO) | Source: Ambulatory Visit | Attending: Internal Medicine | Admitting: Internal Medicine

## 2012-12-04 DIAGNOSIS — C73 Malignant neoplasm of thyroid gland: Secondary | ICD-10-CM | POA: Insufficient documentation

## 2012-12-04 LAB — HCG, SERUM, QUALITATIVE: Preg, Serum: NEGATIVE

## 2012-12-04 MED ORDER — SODIUM IODIDE I 131 CAPSULE
104.0000 | Freq: Once | INTRAVENOUS | Status: AC | PRN
  Administered 2012-12-04: 104 via ORAL

## 2012-12-13 ENCOUNTER — Encounter (HOSPITAL_COMMUNITY)
Admission: RE | Admit: 2012-12-13 | Discharge: 2012-12-13 | Disposition: A | Discharge status: Home / Self Care | Payer: Managed Care, Other (non HMO) | Source: Ambulatory Visit | Attending: Internal Medicine | Admitting: Internal Medicine

## 2012-12-13 DIAGNOSIS — C73 Malignant neoplasm of thyroid gland: Secondary | ICD-10-CM | POA: Insufficient documentation

## 2013-01-20 ENCOUNTER — Encounter (INDEPENDENT_AMBULATORY_CARE_PROVIDER_SITE_OTHER): Payer: Self-pay | Admitting: Surgery

## 2013-05-13 ENCOUNTER — Other Ambulatory Visit: Payer: Self-pay | Admitting: Internal Medicine

## 2013-05-13 DIAGNOSIS — E039 Hypothyroidism, unspecified: Secondary | ICD-10-CM

## 2013-05-14 ENCOUNTER — Ambulatory Visit
Admission: RE | Admit: 2013-05-14 | Discharge: 2013-05-14 | Disposition: A | Discharge status: Home / Self Care | Payer: BC Managed Care – PPO | Source: Ambulatory Visit | Attending: Internal Medicine | Admitting: Internal Medicine

## 2013-05-14 DIAGNOSIS — E039 Hypothyroidism, unspecified: Secondary | ICD-10-CM

## 2013-05-16 ENCOUNTER — Other Ambulatory Visit: Payer: Self-pay | Admitting: Internal Medicine

## 2013-05-16 DIAGNOSIS — E041 Nontoxic single thyroid nodule: Secondary | ICD-10-CM

## 2013-05-19 ENCOUNTER — Other Ambulatory Visit: Payer: Self-pay | Admitting: Internal Medicine

## 2013-05-19 DIAGNOSIS — E041 Nontoxic single thyroid nodule: Secondary | ICD-10-CM

## 2013-05-21 ENCOUNTER — Other Ambulatory Visit (HOSPITAL_COMMUNITY)
Admission: RE | Admit: 2013-05-21 | Discharge: 2013-05-21 | Disposition: A | Discharge status: Home / Self Care | Payer: BC Managed Care – PPO | Source: Ambulatory Visit | Attending: Interventional Radiology | Admitting: Interventional Radiology

## 2013-05-21 ENCOUNTER — Ambulatory Visit
Admission: RE | Admit: 2013-05-21 | Discharge: 2013-05-21 | Disposition: A | Discharge status: Home / Self Care | Payer: BC Managed Care – PPO | Source: Ambulatory Visit | Attending: Internal Medicine | Admitting: Internal Medicine

## 2013-05-21 DIAGNOSIS — E039 Hypothyroidism, unspecified: Secondary | ICD-10-CM | POA: Insufficient documentation

## 2013-05-21 DIAGNOSIS — E041 Nontoxic single thyroid nodule: Secondary | ICD-10-CM

## 2013-05-21 DIAGNOSIS — C73 Malignant neoplasm of thyroid gland: Secondary | ICD-10-CM | POA: Insufficient documentation

## 2013-12-19 IMAGING — US US SOFT TISSUE HEAD/NECK
1 series · 14 of 25 positions shown · non-contrast
Comparison: Ultrasound of the thyroid of 06/10/2012

CLINICAL DATA: Dysphagia, history of thyroidectomy for carcinoma in
6578

EXAM:
THYROID ULTRASOUND

[Series 1: us soft tissue head/neck · 0.09mm/px · 14 of 32 slices shown]
[im 1/32]
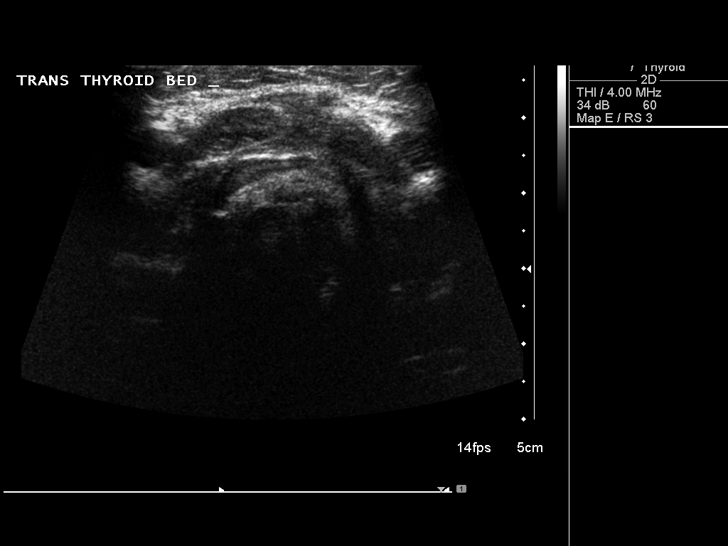
[im 3/32]
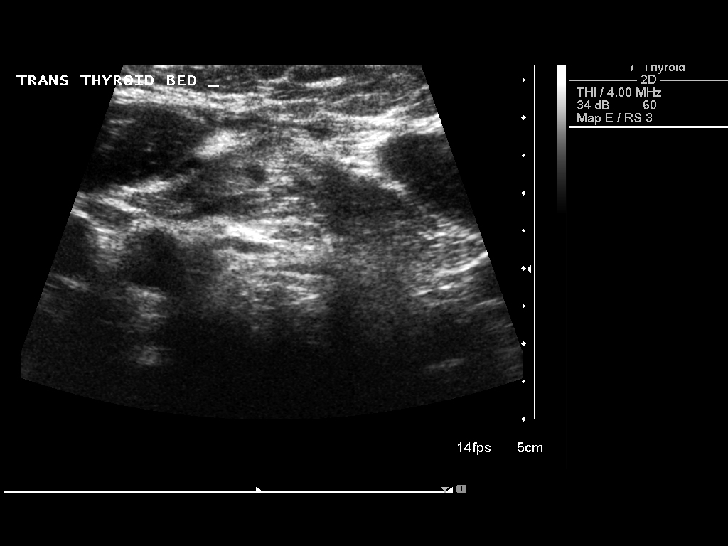
[im 6/32]
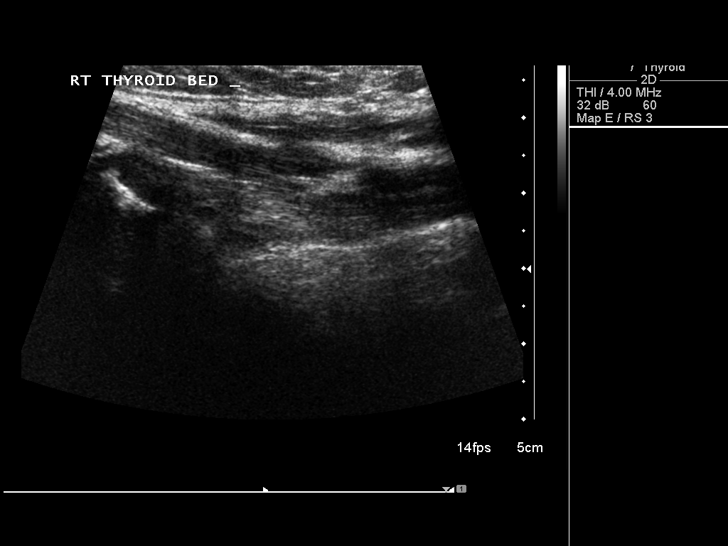
[im 8/32]
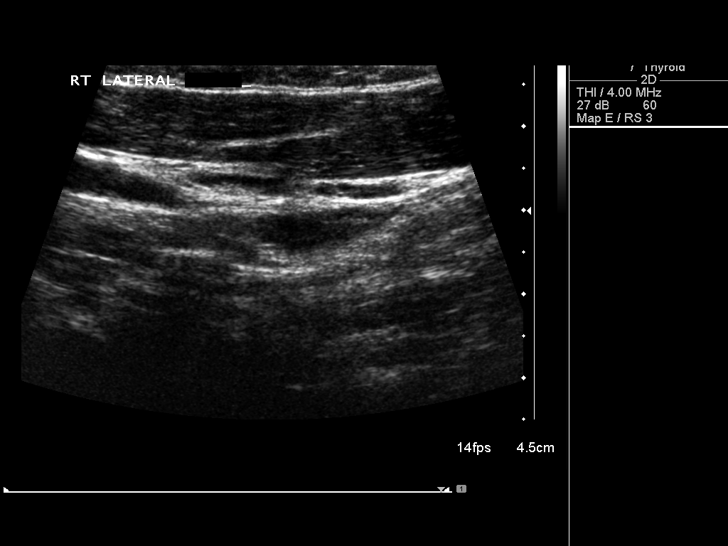
[im 11/32]
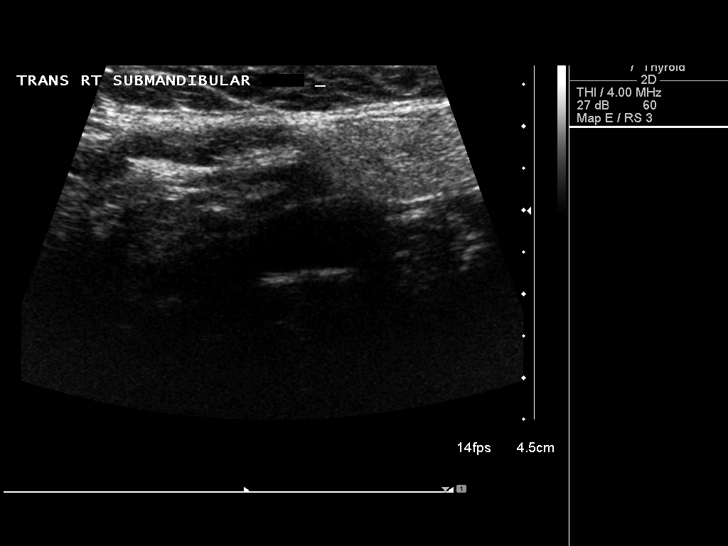
[im 12/32]
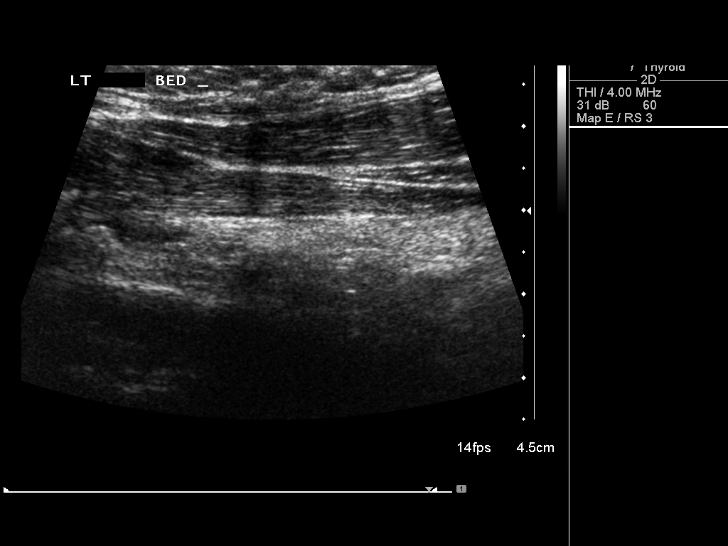
[im 15/32]
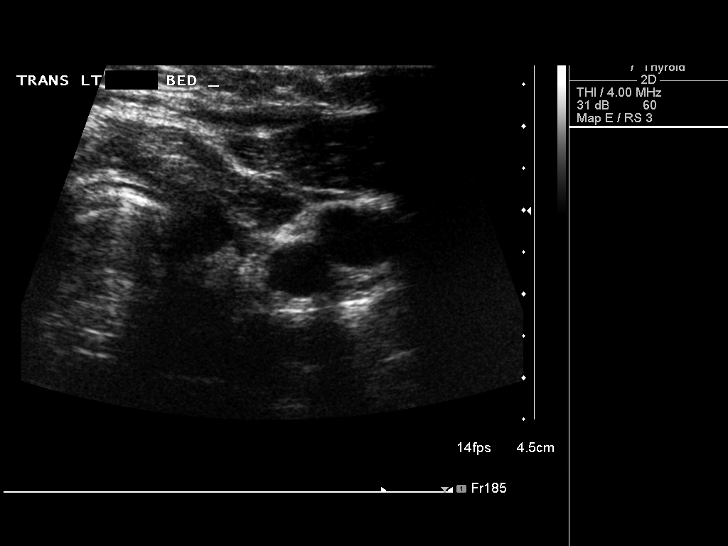
[im 17/32]
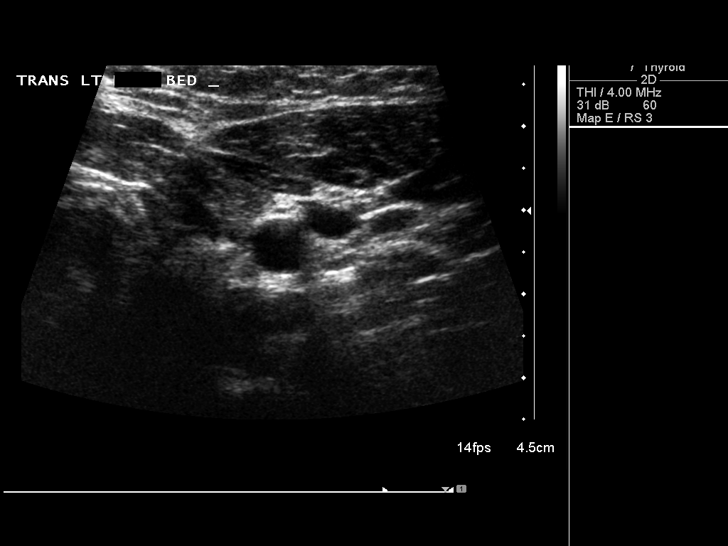
[im 20/32]
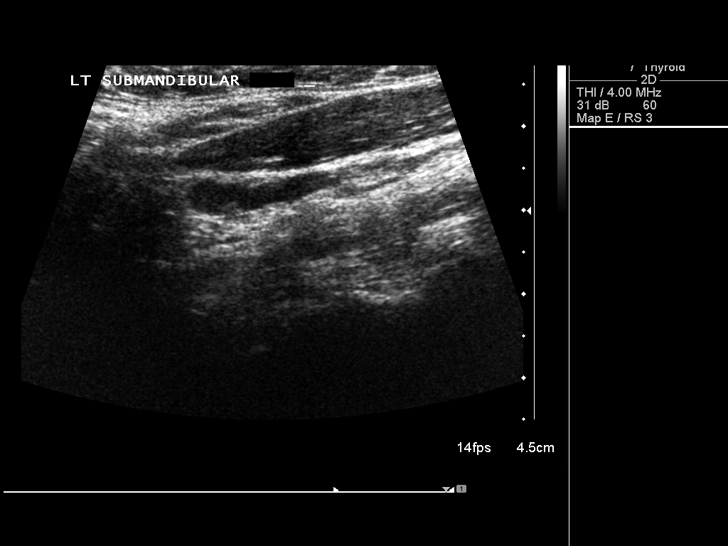
[im 21/32]
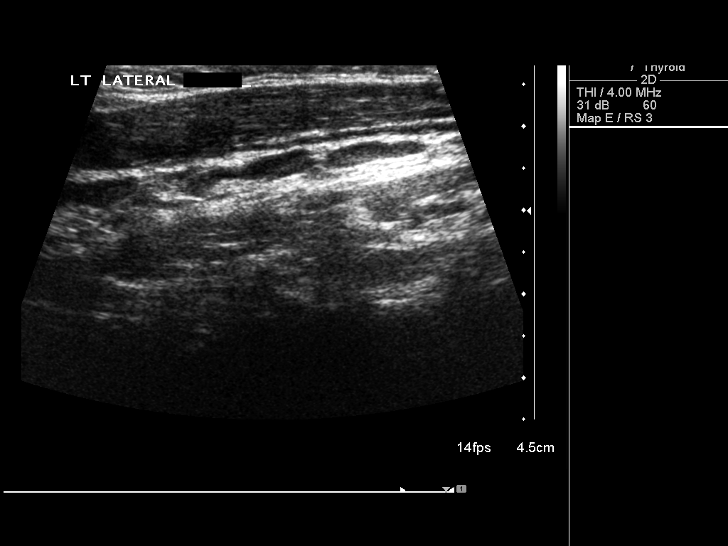
[im 24/32]
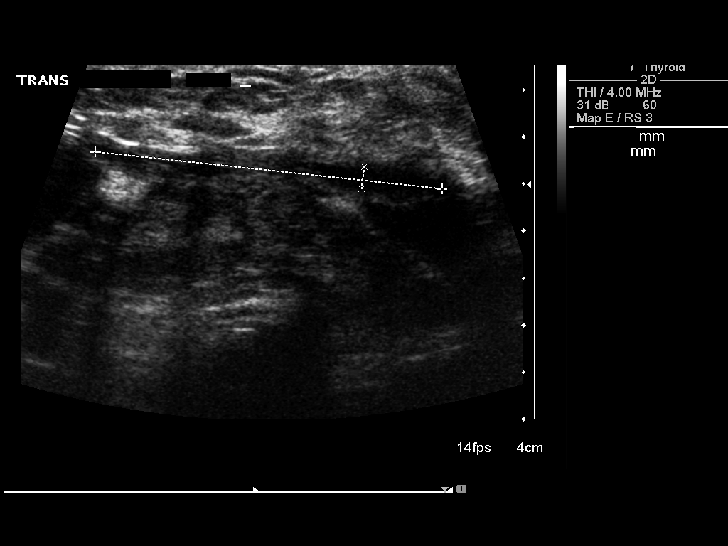
[im 26/32]
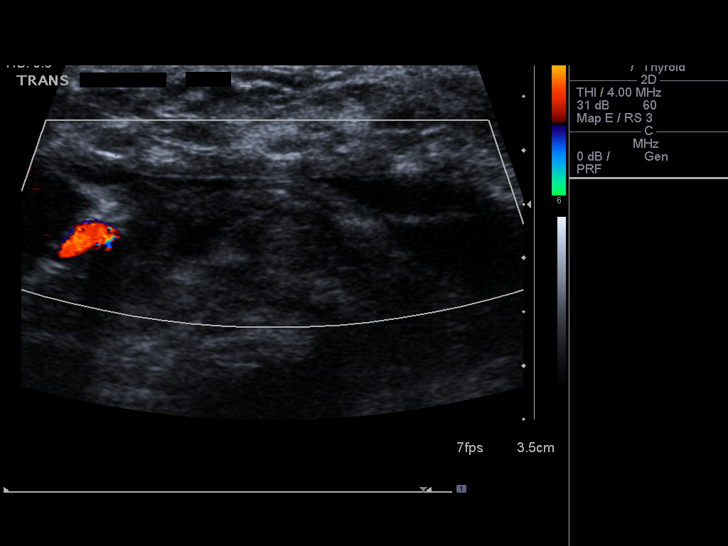
[im 29/32]
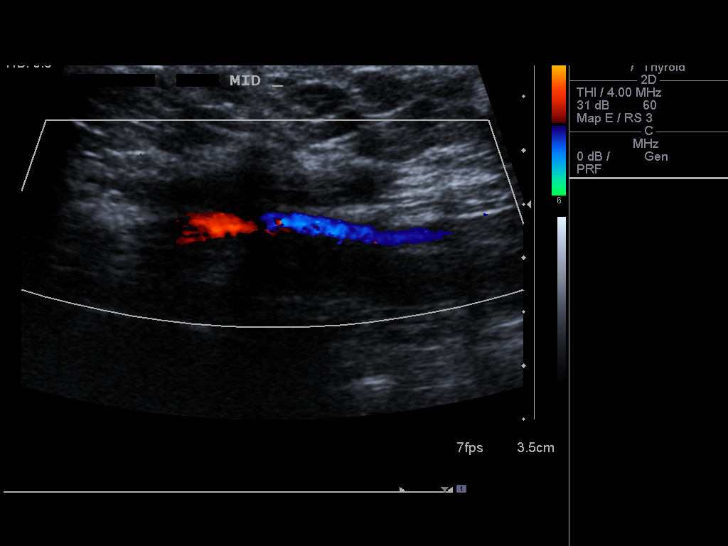
[im 32/32]
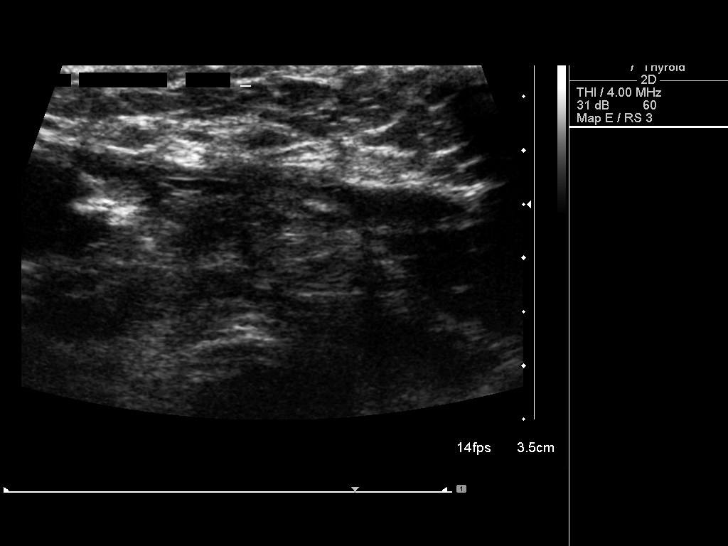

[14 of 25 positions shown; findings below may reference images not displayed]

FINDINGS: The thyroid is surgically absent. No mass is seen in the thyroid
bed. A small fluid collection is noted anteriorly at the incision
site in the left neck of 1.5 x 0.3 x 3.7 cm most likely a
postoperative seroma.
IMPRESSION: Normal post-thyroidectomy appearance. A small fluid collection
anteriorly at the incision site may represent a small postoperative
seroma.

## 2014-02-06 ENCOUNTER — Other Ambulatory Visit: Payer: Self-pay | Admitting: Family Medicine

## 2014-02-06 DIAGNOSIS — R1084 Generalized abdominal pain: Secondary | ICD-10-CM

## 2014-02-11 ENCOUNTER — Other Ambulatory Visit: Payer: Managed Care, Other (non HMO)

## 2014-03-06 ENCOUNTER — Other Ambulatory Visit: Payer: Self-pay | Admitting: Internal Medicine

## 2014-03-06 DIAGNOSIS — C73 Malignant neoplasm of thyroid gland: Secondary | ICD-10-CM

## 2014-03-10 ENCOUNTER — Ambulatory Visit
Admission: RE | Admit: 2014-03-10 | Discharge: 2014-03-10 | Disposition: A | Discharge status: Home / Self Care | Payer: BLUE CROSS/BLUE SHIELD | Source: Ambulatory Visit | Attending: Internal Medicine | Admitting: Internal Medicine

## 2014-03-10 DIAGNOSIS — C73 Malignant neoplasm of thyroid gland: Secondary | ICD-10-CM

## 2014-06-04 ENCOUNTER — Other Ambulatory Visit (HOSPITAL_COMMUNITY)
Admission: RE | Admit: 2014-06-04 | Discharge: 2014-06-04 | Disposition: A | Discharge status: Home / Self Care | Payer: BLUE CROSS/BLUE SHIELD | Source: Ambulatory Visit | Attending: Family Medicine | Admitting: Family Medicine

## 2014-06-04 ENCOUNTER — Other Ambulatory Visit: Payer: Self-pay | Admitting: Family Medicine

## 2014-06-04 DIAGNOSIS — Z113 Encounter for screening for infections with a predominantly sexual mode of transmission: Secondary | ICD-10-CM | POA: Insufficient documentation

## 2014-06-04 DIAGNOSIS — Z124 Encounter for screening for malignant neoplasm of cervix: Secondary | ICD-10-CM | POA: Diagnosis not present

## 2014-06-05 LAB — CYTOLOGY - PAP

## 2014-06-22 ENCOUNTER — Other Ambulatory Visit: Payer: Self-pay | Admitting: Internal Medicine

## 2014-06-22 DIAGNOSIS — E079 Disorder of thyroid, unspecified: Secondary | ICD-10-CM

## 2014-07-15 ENCOUNTER — Ambulatory Visit
Admission: RE | Admit: 2014-07-15 | Discharge: 2014-07-15 | Disposition: A | Discharge status: Home / Self Care | Payer: BLUE CROSS/BLUE SHIELD | Source: Ambulatory Visit | Attending: Internal Medicine | Admitting: Internal Medicine

## 2014-07-15 ENCOUNTER — Other Ambulatory Visit (HOSPITAL_COMMUNITY)
Admission: RE | Admit: 2014-07-15 | Discharge: 2014-07-15 | Disposition: A | Discharge status: Home / Self Care | Payer: BLUE CROSS/BLUE SHIELD | Source: Ambulatory Visit | Attending: Internal Medicine | Admitting: Internal Medicine

## 2014-07-15 DIAGNOSIS — E079 Disorder of thyroid, unspecified: Secondary | ICD-10-CM

## 2014-07-15 DIAGNOSIS — E041 Nontoxic single thyroid nodule: Secondary | ICD-10-CM | POA: Insufficient documentation

## 2014-07-23 ENCOUNTER — Other Ambulatory Visit: Payer: Self-pay | Admitting: Internal Medicine

## 2014-07-23 DIAGNOSIS — C73 Malignant neoplasm of thyroid gland: Secondary | ICD-10-CM

## 2014-07-31 ENCOUNTER — Other Ambulatory Visit (HOSPITAL_COMMUNITY): Payer: Self-pay | Admitting: Internal Medicine

## 2014-08-17 ENCOUNTER — Encounter (HOSPITAL_COMMUNITY)
Admission: RE | Admit: 2014-08-17 | Discharge: 2014-08-17 | Disposition: A | Discharge status: Home / Self Care | Payer: BLUE CROSS/BLUE SHIELD | Source: Ambulatory Visit | Attending: Internal Medicine | Admitting: Internal Medicine

## 2014-08-17 DIAGNOSIS — C73 Malignant neoplasm of thyroid gland: Secondary | ICD-10-CM | POA: Insufficient documentation

## 2014-08-17 MED ORDER — THYROTROPIN ALFA 1.1 MG IM SOLR
0.9000 mg | INTRAMUSCULAR | Status: AC
  Administered 2014-08-17: 0.9 mg via INTRAMUSCULAR

## 2014-08-17 MED ORDER — THYROTROPIN ALFA 1.1 MG IM SOLR
INTRAMUSCULAR | Status: AC
  Filled 2014-08-17: qty 0.9

## 2014-08-18 ENCOUNTER — Encounter (HOSPITAL_COMMUNITY)
Admission: RE | Admit: 2014-08-18 | Discharge: 2014-08-18 | Disposition: A | Discharge status: Home / Self Care | Payer: BLUE CROSS/BLUE SHIELD | Source: Ambulatory Visit | Attending: Internal Medicine | Admitting: Internal Medicine

## 2014-08-18 DIAGNOSIS — C73 Malignant neoplasm of thyroid gland: Secondary | ICD-10-CM | POA: Diagnosis not present

## 2014-08-18 MED ORDER — THYROTROPIN ALFA 1.1 MG IM SOLR
0.9000 mg | INTRAMUSCULAR | Status: AC
  Administered 2014-08-18: 0.9 mg via INTRAMUSCULAR

## 2014-08-18 MED ORDER — THYROTROPIN ALFA 1.1 MG IM SOLR
INTRAMUSCULAR | Status: AC
  Filled 2014-08-18: qty 0.9

## 2014-08-19 ENCOUNTER — Encounter (HOSPITAL_COMMUNITY)
Admission: RE | Admit: 2014-08-19 | Discharge: 2014-08-19 | Disposition: A | Discharge status: Home / Self Care | Payer: BLUE CROSS/BLUE SHIELD | Source: Ambulatory Visit | Attending: Internal Medicine | Admitting: Internal Medicine

## 2014-08-19 DIAGNOSIS — C73 Malignant neoplasm of thyroid gland: Secondary | ICD-10-CM | POA: Diagnosis not present

## 2014-08-19 LAB — HCG, SERUM, QUALITATIVE: Preg, Serum: NEGATIVE

## 2014-08-21 ENCOUNTER — Encounter (HOSPITAL_COMMUNITY)
Admission: RE | Admit: 2014-08-21 | Discharge: 2014-08-21 | Disposition: A | Discharge status: Home / Self Care | Payer: BLUE CROSS/BLUE SHIELD | Source: Ambulatory Visit | Attending: Internal Medicine | Admitting: Internal Medicine

## 2014-08-21 DIAGNOSIS — C73 Malignant neoplasm of thyroid gland: Secondary | ICD-10-CM | POA: Diagnosis not present

## 2014-08-21 MED ORDER — SODIUM IODIDE I 131 CAPSULE
3.9000 | Freq: Once | INTRAVENOUS | Status: AC | PRN
  Administered 2014-08-21: 3.9 via ORAL

## 2014-08-22 LAB — THYROGLOBULIN ANTIBODY: Thyroglobulin Antibody: 21 IU/mL — ABNORMAL HIGH (ref 0.0–0.9)

## 2014-08-26 LAB — THYROGLOBULIN LEVEL: Thyroglobulin: 3.2 ng/mL

## 2015-02-01 ENCOUNTER — Other Ambulatory Visit: Payer: Self-pay | Admitting: Internal Medicine

## 2015-02-01 DIAGNOSIS — C73 Malignant neoplasm of thyroid gland: Secondary | ICD-10-CM

## 2015-02-17 ENCOUNTER — Other Ambulatory Visit: Payer: BLUE CROSS/BLUE SHIELD

## 2015-03-03 ENCOUNTER — Ambulatory Visit
Admission: RE | Admit: 2015-03-03 | Discharge: 2015-03-03 | Disposition: A | Discharge status: Home / Self Care | Payer: 59 | Source: Ambulatory Visit | Attending: Internal Medicine | Admitting: Internal Medicine

## 2015-03-03 DIAGNOSIS — C73 Malignant neoplasm of thyroid gland: Secondary | ICD-10-CM

## 2015-04-28 ENCOUNTER — Emergency Department (HOSPITAL_COMMUNITY): Payer: 59

## 2015-04-28 ENCOUNTER — Emergency Department (HOSPITAL_COMMUNITY)
Admission: EM | Admit: 2015-04-28 | Discharge: 2015-04-28 | Disposition: A | Discharge status: Home / Self Care | Payer: 59 | Attending: Emergency Medicine | Admitting: Emergency Medicine

## 2015-04-28 ENCOUNTER — Encounter (HOSPITAL_COMMUNITY): Payer: Self-pay | Admitting: Emergency Medicine

## 2015-04-28 DIAGNOSIS — J45901 Unspecified asthma with (acute) exacerbation: Secondary | ICD-10-CM | POA: Diagnosis not present

## 2015-04-28 DIAGNOSIS — M546 Pain in thoracic spine: Secondary | ICD-10-CM | POA: Insufficient documentation

## 2015-04-28 DIAGNOSIS — Z87891 Personal history of nicotine dependence: Secondary | ICD-10-CM | POA: Diagnosis not present

## 2015-04-28 DIAGNOSIS — F329 Major depressive disorder, single episode, unspecified: Secondary | ICD-10-CM | POA: Diagnosis not present

## 2015-04-28 DIAGNOSIS — R079 Chest pain, unspecified: Secondary | ICD-10-CM | POA: Insufficient documentation

## 2015-04-28 DIAGNOSIS — E039 Hypothyroidism, unspecified: Secondary | ICD-10-CM | POA: Diagnosis not present

## 2015-04-28 DIAGNOSIS — Z79899 Other long term (current) drug therapy: Secondary | ICD-10-CM | POA: Insufficient documentation

## 2015-04-28 DIAGNOSIS — R112 Nausea with vomiting, unspecified: Secondary | ICD-10-CM | POA: Insufficient documentation

## 2015-04-28 DIAGNOSIS — R12 Heartburn: Secondary | ICD-10-CM

## 2015-04-28 DIAGNOSIS — Z7982 Long term (current) use of aspirin: Secondary | ICD-10-CM | POA: Diagnosis not present

## 2015-04-28 DIAGNOSIS — Z862 Personal history of diseases of the blood and blood-forming organs and certain disorders involving the immune mechanism: Secondary | ICD-10-CM | POA: Diagnosis not present

## 2015-04-28 DIAGNOSIS — Z8719 Personal history of other diseases of the digestive system: Secondary | ICD-10-CM | POA: Diagnosis not present

## 2015-04-28 DIAGNOSIS — Z859 Personal history of malignant neoplasm, unspecified: Secondary | ICD-10-CM | POA: Diagnosis not present

## 2015-04-28 LAB — BASIC METABOLIC PANEL
Anion gap: 9 (ref 5–15)
BUN: 11 mg/dL (ref 6–20)
CALCIUM: 8.3 mg/dL — AB (ref 8.9–10.3)
CO2: 27 mmol/L (ref 22–32)
CREATININE: 0.92 mg/dL (ref 0.44–1.00)
Chloride: 103 mmol/L (ref 101–111)
Glucose, Bld: 81 mg/dL (ref 65–99)
Potassium: 4 mmol/L (ref 3.5–5.1)
SODIUM: 139 mmol/L (ref 135–145)

## 2015-04-28 LAB — CBC
HCT: 40.3 % (ref 36.0–46.0)
Hemoglobin: 12.9 g/dL (ref 12.0–15.0)
MCH: 27.7 pg (ref 26.0–34.0)
MCHC: 32 g/dL (ref 30.0–36.0)
MCV: 86.7 fL (ref 78.0–100.0)
PLATELETS: 302 10*3/uL (ref 150–400)
RBC: 4.65 MIL/uL (ref 3.87–5.11)
RDW: 14 % (ref 11.5–15.5)
WBC: 10.7 10*3/uL — AB (ref 4.0–10.5)

## 2015-04-28 LAB — I-STAT TROPONIN, ED
TROPONIN I, POC: 0 ng/mL (ref 0.00–0.08)
Troponin i, poc: 0 ng/mL (ref 0.00–0.08)

## 2015-04-28 LAB — PREGNANCY, URINE: Preg Test, Ur: NEGATIVE

## 2015-04-28 LAB — D-DIMER, QUANTITATIVE (NOT AT ARMC): D-Dimer, Quant: 2.09 ug/mL-FEU — ABNORMAL HIGH (ref 0.00–0.50)

## 2015-04-28 MED ORDER — MORPHINE SULFATE (PF) 4 MG/ML IV SOLN
4.0000 mg | Freq: Once | INTRAVENOUS | Status: AC
  Administered 2015-04-28: 4 mg via INTRAVENOUS
  Filled 2015-04-28: qty 1

## 2015-04-28 MED ORDER — IOPAMIDOL (ISOVUE-370) INJECTION 76%
100.0000 mL | Freq: Once | INTRAVENOUS | Status: AC | PRN
  Administered 2015-04-28: 100 mL via INTRAVENOUS

## 2015-04-28 MED ORDER — ONDANSETRON HCL 4 MG/2ML IJ SOLN
4.0000 mg | Freq: Once | INTRAMUSCULAR | Status: AC
  Administered 2015-04-28: 4 mg via INTRAVENOUS
  Filled 2015-04-28: qty 2

## 2015-04-28 MED ORDER — OMEPRAZOLE 20 MG PO CPDR: 20.0000 mg | DELAYED_RELEASE_CAPSULE | Freq: Every day | ORAL | Status: AC

## 2015-04-28 MED ORDER — GI COCKTAIL ~~LOC~~
30.0000 mL | Freq: Once | ORAL | Status: AC
  Administered 2015-04-28: 30 mL via ORAL
  Filled 2015-04-28: qty 30

## 2015-04-28 MED ORDER — HYDROMORPHONE HCL 1 MG/ML IJ SOLN
1.0000 mg | Freq: Once | INTRAMUSCULAR | Status: DC
  Filled 2015-04-28: qty 1

## 2015-04-28 MED ORDER — TRAMADOL HCL 50 MG PO TABS: 50.0000 mg | ORAL_TABLET | Freq: Four times a day (QID) | ORAL | Status: AC | PRN

## 2015-04-28 MED ORDER — SODIUM CHLORIDE 0.9 % IV BOLUS (SEPSIS)
1000.0000 mL | Freq: Once | INTRAVENOUS | Status: AC
  Administered 2015-04-28: 1000 mL via INTRAVENOUS

## 2015-04-28 NOTE — ED Provider Notes (Signed)
CSN: [id number]     Arrival date & time 04/28/15  1032 History   First MD Initiated Contact with Patient 04/28/15 1335     Chief Complaint  Patient presents with  . Chest Pain     (Consider location/radiation/quality/duration/timing/severity/associated sxs/prior Treatment) HPI Comments: Patient is a 30 year old female with history of thyroid cancer who presents with left-sided chest pain. Patient states she began feeling heartburn and acidic burps yesterday around 8 PM. The patient took some Zantac with no help, but eventually died down. About 2 hours later patient began feeling a different pain that she describes as numbing, throbbing with intermittent feelings of shooting, stabbing pain to her left chest. Patient reports that the pain worsens with deep breath, but is not sharp. She states that deep breaths just "intensify the pain." She states that pain radiates to her jaw, left arm, directly to her mid back. The patient tried aspirin and tramadol in the middle of the night last night with no relief. However did help her sleep, but the pain still there when she awoke. Patient had 2 episodes of nausea and vomiting earlier today. Patient endorses looser stools of recent, but denies any blood. Patient did not have an appetite yesterday before the pain began. Patient has not eaten the past 24 hours. Patient does not have a good appetite at baseline. The patient's last radiation treatment was August for her thyroid cancer. Patient takes her daily Synthroid, but no new medicines. Patient states her heartburn has returned top of the left chest pain. Patient denies abdominal pain, dysuria. Patient denies any recent long trips, surgeries, or unilateral leg swelling.  Patient is a 30 y.o. female presenting with chest pain. The history is provided by the patient.  Chest Pain Associated symptoms: back pain, nausea, shortness of breath and vomiting   Associated symptoms: no abdominal pain, no fever and no  headache     Past Medical History  Diagnosis Date  . Thyroid disease   . Asthma   . Gastric ulcer   . Depression   . Hypothyroidism   . Anemia   . Cancer [facility])    Past Surgical History  Procedure Laterality Date  . Tonsillectomy  age 67  . Wisdom tooth extraction  age 35  . Cholecystectomy    . Thyroidectomy N/A 08/30/2012    Procedure: THYROIDECTOMY;  Surgeon: Earnstine Regal, MD;  Location: [facility];  Service: General;  Laterality: N/A;   Family History  Problem Relation Age of Onset  . Adopted: Yes  . Diabetes Father    Social History  Substance Use Topics  . Smoking status: Former Smoker    Quit date: 01/03/2008  . Smokeless tobacco: None  . Alcohol Use: Yes     Comment: rare   OB History    No data available     Review of Systems  Constitutional: Negative for fever and chills.  HENT: Negative for facial swelling and sore throat.   Respiratory: Positive for shortness of breath.   Cardiovascular: Positive for chest pain.  Gastrointestinal: Positive for nausea and vomiting. Negative for abdominal pain.  Genitourinary: Negative for dysuria.  Musculoskeletal: Positive for back pain.  Skin: Negative for rash and wound.  Neurological: Negative for headaches.  Psychiatric/Behavioral: The patient is not nervous/anxious.       Allergies  Coconut flavor and Adhesive  Home Medications   Prior to Admission medications   Medication Sig Start Date End Date Taking? Authorizing Provider  albuterol (PROVENTIL HFA;VENTOLIN HFA) 108 (  90 Base) MCG/ACT inhaler Inhale 1-2 puffs into the lungs every 6 (six) hours as needed for wheezing or shortness of breath.   Yes Historical Provider, MD  ALPRAZolam Duanne Moron) 0.25 MG tablet Take 0.25 mg by mouth daily as needed for anxiety.   Yes Historical Provider, MD  aspirin EC 325 MG tablet Take 325 mg by mouth daily.   Yes Historical Provider, MD  levothyroxine (SYNTHROID, LEVOTHROID) 137 MCG tablet Take 137 mcg by mouth daily before  breakfast.   Yes Historical Provider, MD  Omega-3 Fatty Acids (FISH OIL PO) Take 1 capsule by mouth daily.   Yes Historical Provider, MD  traMADol (ULTRAM) 50 MG tablet Take 50 mg by mouth every 6 (six) hours as needed for moderate pain.   Yes Historical Provider, MD  calcium carbonate (OS-CAL - DOSED IN MG OF ELEMENTAL CALCIUM) 1250 MG tablet Take 2 tablets (1,000 mg of elemental calcium total) by mouth 3 (three) times daily with meals. Patient not taking: Reported on 04/28/2015 08/31/12   Madilyn Hook, DO   BP 107/70 mmHg  Pulse 78  Temp(Src) 98.3 F (36.8 C) (Oral)  Resp 17  Wt 90.719 kg  SpO2 97%  LMP 04/03/2015 Physical Exam  Constitutional: She appears well-developed and well-nourished. No distress.  HENT:  Head: Normocephalic and atraumatic.  Mouth/Throat: Oropharynx is clear and moist. No oropharyngeal exudate.  Eyes: Conjunctivae are normal. Pupils are equal, round, and reactive to light. Right eye exhibits no discharge. Left eye exhibits no discharge. No scleral icterus.  Neck: Normal range of motion. Neck supple. No thyromegaly present.  Cardiovascular: Normal rate, regular rhythm, normal heart sounds and intact distal pulses.  Exam reveals no gallop and no friction rub.   No murmur heard. Pulmonary/Chest: Effort normal and breath sounds normal. No stridor. No respiratory distress. She has no wheezes. She has no rales. She exhibits no tenderness.  Abdominal: Soft. Bowel sounds are normal. She exhibits no distension. There is no tenderness. There is no rebound and no guarding.  Musculoskeletal: She exhibits no edema.       Thoracic back: She exhibits no tenderness and no bony tenderness.       Lumbar back: She exhibits no tenderness and no bony tenderness.  Patient does not elicit tenderness in the area where she has been feeling the pain in her back  Lymphadenopathy:    She has no cervical adenopathy.  Neurological: She is alert. Coordination normal.  Skin: Skin is warm and  dry. No rash noted. She is not diaphoretic. No pallor.  Psychiatric: She has a normal mood and affect.  Nursing note and vitals reviewed.   ED Course  Procedures (including critical care time) Labs Review Labs Reviewed  BASIC METABOLIC PANEL - Abnormal; Notable for the following:    Calcium 8.3 (*)    All other components within normal limits  CBC - Abnormal; Notable for the following:    WBC 10.7 (*)    All other components within normal limits  D-DIMER, QUANTITATIVE (NOT AT [facility]) - Abnormal; Notable for the following:    D-Dimer, Quant 2.09 (*)    All other components within normal limits  PREGNANCY, URINE  I-STAT TROPOININ, ED  Randolm Idol, ED    Imaging Review Dg Chest 2 View  04/28/2015  CLINICAL DATA:  New the mid chest pain and mid back pain with shortness of breath. EXAM: CHEST  2 VIEW COMPARISON:  08/23/2012 FINDINGS: The heart size and mediastinal contours are within normal limits. Both lungs  are clear. There is a chronic slight thoracic scoliosis, essentially unchanged. IMPRESSION: No active cardiopulmonary disease.  Thoracic scoliosis. Electronically Signed   By: Lorriane Shire M.D.   On: 04/28/2015 12:25   I have personally reviewed and evaluated these images and lab results as part of my medical decision-making.   EKG Interpretation   Date/Time:  Wednesday April 28 2015 10:39:56 EDT Ventricular Rate:  90 PR Interval:  156 QRS Duration: 89 QT Interval:  365 QTC Calculation: 447 R Axis:   89 Text Interpretation:  Sinus rhythm Borderline T abnormalities, diffuse  leads Baseline wander in lead(s) V2 V6 Confirmed by COOK  MD, BRIAN  ([id number]) on 04/28/2015 2:20:32 PM      3:36pm- patient's heartburn has resolved with GI cocktail, the patient continues to have worsening chest pain. Will order 1mg   Dilaudid. Elevated d-dimer; patient made aware of CT angiogram PE study 4:02pm- Patient denied Dilaudid, because in the past that did nothing for her. Patient would  rather try morphine again.  MDM   CBC shows elevated WBC 10.7. BMP unremarkable.Troponin 0.00. D-dimer 2.09. Chest x-ray shows no active cardiopulmonary disease. EKG shows NSR. At shift change, patient care transferred to [facility], PA-C for continued evaluation, follow up of CT angio PE study, second troponin and determination of disposition. Anticipate discharge if negative CT angiogram and second troponin. Patient also evaluated by Dr. Lacinda Axon who is in agreement with plan.    Final diagnoses:  Chest pain, unspecified chest pain type      Frederica Kuster, PA-C 04/28/15 [place], MD 04/29/15 [id number]

## 2015-04-28 NOTE — ED Notes (Signed)
Pt reports pain in center of chest that started as heartburn and then constant pressure. Reports x1 n/v episode. Pt is alert and oriented at triage with quick and steady gait. NAD. Hx of thyroid cancer.

## 2015-04-28 NOTE — ED Provider Notes (Signed)
30 year old female signed out to me at shift change by Armstead Peaks PA-C pending CT scan and reevaluation. Please see previous provider's note for full H&P. Briefly history patient reports heartburn indigestion starting on a p.m. with associated chest pain. No history of the same, this pain was improved here in the ED with GI cocktail. Patient had negative delta troponin, negative CT scan for pulmonary embolism, and reassuring vital signs and remaining laboratory analysis. Patient signs and symptoms are most consistent with indigestion, she will be discharged home with omeprazole, primary care follow-up, strict return precautions. Patient verbalized her understanding and agreement to today's plan had no further questions or concerns at time of discharge.   Filed Vitals:   04/28/15 1448 04/28/15 1449  BP: 107/70   Pulse: 78 78  Temp:    Resp: [id number] PA-C 04/28/15 1705

## 2015-09-14 ENCOUNTER — Other Ambulatory Visit: Payer: Self-pay | Admitting: Internal Medicine

## 2015-09-14 DIAGNOSIS — R251 Tremor, unspecified: Secondary | ICD-10-CM

## 2015-09-14 DIAGNOSIS — R51 Headache: Principal | ICD-10-CM

## 2015-09-14 DIAGNOSIS — R1111 Vomiting without nausea: Secondary | ICD-10-CM

## 2015-09-14 DIAGNOSIS — R519 Headache, unspecified: Secondary | ICD-10-CM

## 2015-09-24 ENCOUNTER — Ambulatory Visit
Admission: RE | Admit: 2015-09-24 | Discharge: 2015-09-24 | Disposition: A | Discharge status: Home / Self Care | Payer: 59 | Source: Ambulatory Visit | Attending: Internal Medicine | Admitting: Internal Medicine

## 2015-09-24 DIAGNOSIS — R519 Headache, unspecified: Secondary | ICD-10-CM

## 2015-09-24 DIAGNOSIS — R1111 Vomiting without nausea: Secondary | ICD-10-CM

## 2015-09-24 DIAGNOSIS — R51 Headache: Principal | ICD-10-CM

## 2015-09-24 DIAGNOSIS — R251 Tremor, unspecified: Secondary | ICD-10-CM

## 2015-09-24 MED ORDER — GADOBENATE DIMEGLUMINE 529 MG/ML IV SOLN
20.0000 mL | Freq: Once | INTRAVENOUS | Status: AC | PRN
  Administered 2015-09-24: 20 mL via INTRAVENOUS

## 2015-10-06 ENCOUNTER — Other Ambulatory Visit: Payer: Self-pay | Admitting: Internal Medicine

## 2015-10-06 DIAGNOSIS — C73 Malignant neoplasm of thyroid gland: Secondary | ICD-10-CM

## 2015-10-13 ENCOUNTER — Ambulatory Visit
Admission: RE | Admit: 2015-10-13 | Discharge: 2015-10-13 | Disposition: A | Discharge status: Home / Self Care | Payer: 59 | Source: Ambulatory Visit | Attending: Internal Medicine | Admitting: Internal Medicine

## 2015-10-13 DIAGNOSIS — C73 Malignant neoplasm of thyroid gland: Secondary | ICD-10-CM

## 2015-10-30 IMAGING — NM NM [ID] THYROID CANCER METS WHOLE BODY W/ THYROGEN
5 series · 5 of 5 positions shown · non-contrast
Comparison: Posttherapy scan 12/13/2012, ultrasound is 03/10/2014

CLINICAL DATA: Papillary thyroid carcinoma.  Routine surveillance

EXAM:
THYROGEN-STIMULATED D-QLQ WHOLE BODY SCAN
TECHNIQUE: The patient received 0.9 mg Thyrogen intramuscularly every 24 hours
for two doses. On the third day the patient returned and received
the radiopharmaceutical, per orally. On the fifth day, the patient
returned and whole body planar images were obtained in the anterior
and posterior projections.
RADIOPHARMACEUTICALS:  4.0 mCi D-QLQ sodium iodide orally

[Series 1: i131 whole body · 2.66mm/px · 1 of 1 slices shown (1 of 2)]
[im 1/1  full-range]
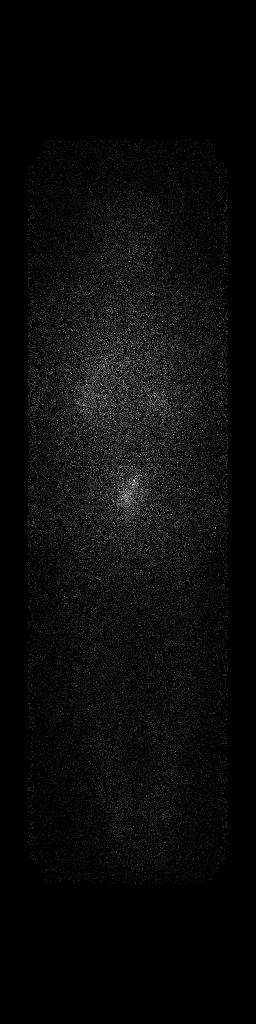

[Series 1: i131 whole body · 2.66mm/px · 1 of 1 slices shown (2 of 2)]
[im 1/1  full-range]
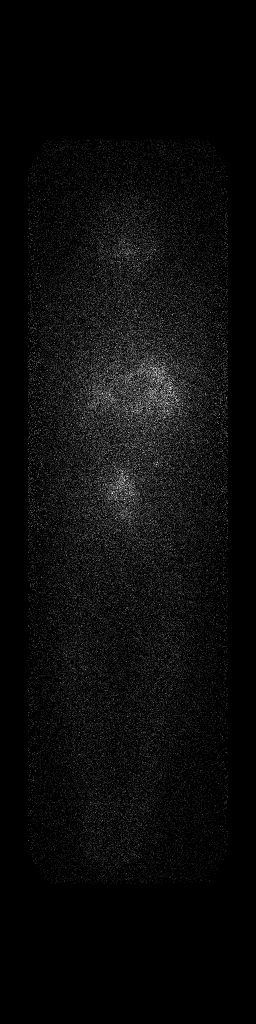

[Series 2: static with marker · 4.14mm/px · 1 of 1 slices shown]
[im 1/1  full-range]
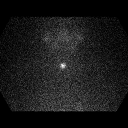

[Series 3: static without marker · non-contrast · 4.14mm/px · 1 of 1 slices shown (1 of 2)]
[im 1/1  full-range]
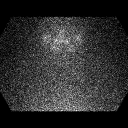

[Series 3: static without marker · non-contrast · 4.14mm/px · 1 of 1 slices shown (2 of 2)]
[im 1/1  full-range]
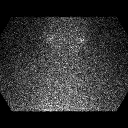

[5 of 5 positions shown; findings below may reference images not displayed]

FINDINGS: No abnormal activity within the thyroid bed. No abnormal activity on
whole-body scan. Physiologic activity noted in the GI and GU tract.
IMPRESSION: No evidence of local thyroid cancer recurrence or distant
metastasis. Recommend correlation with thyroglobulin levels.
